# Patient Record
Sex: Female | Born: 1955
Health system: Southern US, Community
[De-identification: ages and names within clinical notes are randomized; demographics above are authoritative.]

## PROBLEM LIST (undated history)

## (undated) DIAGNOSIS — I10 Essential (primary) hypertension: Secondary | ICD-10-CM

## (undated) DIAGNOSIS — R112 Nausea with vomiting, unspecified: Secondary | ICD-10-CM

## (undated) DIAGNOSIS — F319 Bipolar disorder, unspecified: Secondary | ICD-10-CM

## (undated) DIAGNOSIS — F329 Major depressive disorder, single episode, unspecified: Secondary | ICD-10-CM

## (undated) DIAGNOSIS — Q782 Osteopetrosis: Secondary | ICD-10-CM

## (undated) DIAGNOSIS — H9190 Unspecified hearing loss, unspecified ear: Secondary | ICD-10-CM

## (undated) DIAGNOSIS — Z9889 Other specified postprocedural states: Secondary | ICD-10-CM

## (undated) DIAGNOSIS — M545 Low back pain, unspecified: Secondary | ICD-10-CM

## (undated) DIAGNOSIS — E119 Type 2 diabetes mellitus without complications: Secondary | ICD-10-CM

## (undated) DIAGNOSIS — E1069 Type 1 diabetes mellitus with other specified complication: Secondary | ICD-10-CM

## (undated) DIAGNOSIS — R011 Cardiac murmur, unspecified: Secondary | ICD-10-CM

## (undated) DIAGNOSIS — Z9641 Presence of insulin pump (external) (internal): Secondary | ICD-10-CM

## (undated) DIAGNOSIS — M4802 Spinal stenosis, cervical region: Secondary | ICD-10-CM

## (undated) DIAGNOSIS — E785 Hyperlipidemia, unspecified: Secondary | ICD-10-CM

## (undated) DIAGNOSIS — M47812 Spondylosis without myelopathy or radiculopathy, cervical region: Secondary | ICD-10-CM

## (undated) DIAGNOSIS — M6780 Other specified disorders of synovium and tendon, unspecified site: Secondary | ICD-10-CM

## (undated) DIAGNOSIS — E079 Disorder of thyroid, unspecified: Secondary | ICD-10-CM

## (undated) DIAGNOSIS — M720 Palmar fascial fibromatosis [Dupuytren]: Secondary | ICD-10-CM

## (undated) DIAGNOSIS — K219 Gastro-esophageal reflux disease without esophagitis: Secondary | ICD-10-CM

## (undated) DIAGNOSIS — F419 Anxiety disorder, unspecified: Secondary | ICD-10-CM

## (undated) DIAGNOSIS — E1065 Type 1 diabetes mellitus with hyperglycemia: Secondary | ICD-10-CM

## (undated) DIAGNOSIS — J189 Pneumonia, unspecified organism: Secondary | ICD-10-CM

## (undated) DIAGNOSIS — G5600 Carpal tunnel syndrome, unspecified upper limb: Secondary | ICD-10-CM

## (undated) DIAGNOSIS — C801 Malignant (primary) neoplasm, unspecified: Secondary | ICD-10-CM

## (undated) DIAGNOSIS — E1140 Type 2 diabetes mellitus with diabetic neuropathy, unspecified: Secondary | ICD-10-CM

## (undated) DIAGNOSIS — G8929 Other chronic pain: Secondary | ICD-10-CM

## (undated) DIAGNOSIS — G4730 Sleep apnea, unspecified: Secondary | ICD-10-CM

## (undated) DIAGNOSIS — M797 Fibromyalgia: Secondary | ICD-10-CM

## (undated) DIAGNOSIS — Z8709 Personal history of other diseases of the respiratory system: Secondary | ICD-10-CM

## (undated) DIAGNOSIS — R51 Headache: Secondary | ICD-10-CM

## (undated) HISTORY — PX: DUPUYTREN CONTRACTURE RELEASE: SHX1478

## (undated) HISTORY — DX: Type 2 diabetes mellitus without complications: E11.9

## (undated) HISTORY — PX: ELBOW SURGERY: SHX618

## (undated) HISTORY — PX: REFRACTIVE SURGERY: SHX103

## (undated) HISTORY — DX: Disorder of thyroid, unspecified: E07.9

## (undated) HISTORY — PX: INCISION AND DRAINAGE OF WOUND: SHX1803

## (undated) HISTORY — PX: HAND SURGERY: SHX662

## (undated) HISTORY — PX: EYE SURGERY: SHX253

## (undated) HISTORY — PX: CERVICAL LAMINECTOMY: SHX94

---
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
## 1998-04-10 ENCOUNTER — Encounter: Admission: RE | Admit: 1998-04-10 | Discharge: 1998-07-09 | Payer: Self-pay | Admitting: Endocrinology

## 1998-10-16 ENCOUNTER — Emergency Department (HOSPITAL_COMMUNITY): Admission: EM | Admit: 1998-10-16 | Discharge: 1998-10-16 | Payer: Self-pay | Admitting: Emergency Medicine

## 1998-10-17 ENCOUNTER — Encounter: Payer: Self-pay | Admitting: Emergency Medicine

## 1999-05-11 ENCOUNTER — Encounter: Admission: RE | Admit: 1999-05-11 | Discharge: 1999-08-09 | Payer: Self-pay | Admitting: Endocrinology

## 2001-03-08 ENCOUNTER — Encounter (INDEPENDENT_AMBULATORY_CARE_PROVIDER_SITE_OTHER): Payer: Self-pay | Admitting: Specialist

## 2001-03-08 ENCOUNTER — Ambulatory Visit (HOSPITAL_COMMUNITY): Admission: RE | Admit: 2001-03-08 | Discharge: 2001-03-08 | Payer: Self-pay | Admitting: *Deleted

## 2001-08-25 ENCOUNTER — Other Ambulatory Visit: Admission: RE | Admit: 2001-08-25 | Discharge: 2001-08-25 | Payer: Self-pay | Admitting: Endocrinology

## 2001-08-25 ENCOUNTER — Encounter: Payer: Self-pay | Admitting: Cardiovascular Disease

## 2002-10-12 ENCOUNTER — Ambulatory Visit (HOSPITAL_COMMUNITY): Admission: RE | Admit: 2002-10-12 | Discharge: 2002-10-12 | Payer: Self-pay | Admitting: Endocrinology

## 2002-10-12 ENCOUNTER — Encounter: Payer: Self-pay | Admitting: Endocrinology

## 2002-10-15 ENCOUNTER — Inpatient Hospital Stay (HOSPITAL_COMMUNITY): Admission: EM | Admit: 2002-10-15 | Discharge: 2002-10-18 | Payer: Self-pay | Admitting: Endocrinology

## 2002-10-15 ENCOUNTER — Emergency Department (HOSPITAL_COMMUNITY): Admission: EM | Admit: 2002-10-15 | Discharge: 2002-10-15 | Payer: Self-pay | Admitting: Emergency Medicine

## 2002-10-15 ENCOUNTER — Encounter: Payer: Self-pay | Admitting: Endocrinology

## 2003-04-26 ENCOUNTER — Other Ambulatory Visit (HOSPITAL_COMMUNITY): Admission: RE | Admit: 2003-04-26 | Discharge: 2003-07-25 | Payer: Self-pay | Admitting: Psychiatry

## 2003-05-20 ENCOUNTER — Encounter: Admission: RE | Admit: 2003-05-20 | Discharge: 2003-08-18 | Payer: Self-pay | Admitting: Endocrinology

## 2003-09-12 ENCOUNTER — Encounter: Admission: RE | Admit: 2003-09-12 | Discharge: 2003-12-11 | Payer: Self-pay | Admitting: Endocrinology

## 2005-02-01 ENCOUNTER — Ambulatory Visit (HOSPITAL_COMMUNITY): Admission: RE | Admit: 2005-02-01 | Discharge: 2005-02-01 | Payer: Self-pay

## 2005-02-09 ENCOUNTER — Encounter: Admission: RE | Admit: 2005-02-09 | Discharge: 2005-02-09 | Payer: Self-pay

## 2005-02-24 ENCOUNTER — Encounter: Admission: RE | Admit: 2005-02-24 | Discharge: 2005-02-24 | Payer: Self-pay | Admitting: Neurosurgery

## 2005-03-10 ENCOUNTER — Encounter: Admission: RE | Admit: 2005-03-10 | Discharge: 2005-03-10 | Payer: Self-pay

## 2006-02-04 ENCOUNTER — Encounter: Admission: RE | Admit: 2006-02-04 | Discharge: 2006-02-04 | Payer: Self-pay

## 2006-02-24 ENCOUNTER — Encounter: Admission: RE | Admit: 2006-02-24 | Discharge: 2006-02-24 | Payer: Self-pay | Admitting: Endocrinology

## 2006-03-17 ENCOUNTER — Encounter: Admission: RE | Admit: 2006-03-17 | Discharge: 2006-03-17 | Payer: Self-pay | Admitting: Endocrinology

## 2006-04-07 ENCOUNTER — Encounter: Admission: RE | Admit: 2006-04-07 | Discharge: 2006-04-07 | Payer: Self-pay | Admitting: Endocrinology

## 2009-02-06 ENCOUNTER — Encounter: Admission: RE | Admit: 2009-02-06 | Discharge: 2009-04-22 | Payer: Self-pay | Admitting: Endocrinology

## 2009-06-21 ENCOUNTER — Encounter: Admission: RE | Admit: 2009-06-21 | Discharge: 2009-06-21 | Payer: Self-pay | Admitting: Orthopedic Surgery

## 2009-10-31 ENCOUNTER — Emergency Department (HOSPITAL_BASED_OUTPATIENT_CLINIC_OR_DEPARTMENT_OTHER): Admission: EM | Admit: 2009-10-31 | Discharge: 2009-10-31 | Payer: Self-pay | Admitting: Emergency Medicine

## 2009-10-31 ENCOUNTER — Ambulatory Visit: Payer: Self-pay | Admitting: Diagnostic Radiology

## 2009-11-01 ENCOUNTER — Emergency Department (HOSPITAL_BASED_OUTPATIENT_CLINIC_OR_DEPARTMENT_OTHER): Admission: EM | Admit: 2009-11-01 | Discharge: 2009-11-01 | Payer: Self-pay | Admitting: Emergency Medicine

## 2009-11-02 ENCOUNTER — Emergency Department (HOSPITAL_BASED_OUTPATIENT_CLINIC_OR_DEPARTMENT_OTHER): Admission: EM | Admit: 2009-11-02 | Discharge: 2009-11-02 | Payer: Self-pay | Admitting: Emergency Medicine

## 2009-11-03 ENCOUNTER — Inpatient Hospital Stay (HOSPITAL_COMMUNITY): Admission: EM | Admit: 2009-11-03 | Discharge: 2009-11-07 | Payer: Self-pay | Admitting: Orthopedic Surgery

## 2009-11-22 ENCOUNTER — Ambulatory Visit (HOSPITAL_COMMUNITY)
Admission: RE | Admit: 2009-11-22 | Discharge: 2009-11-22 | Payer: Self-pay | Source: Home / Self Care | Admitting: Orthopedic Surgery

## 2010-01-16 ENCOUNTER — Encounter: Payer: Self-pay | Admitting: Cardiovascular Disease

## 2010-03-30 ENCOUNTER — Encounter: Payer: Self-pay | Admitting: Cardiovascular Disease

## 2010-04-02 ENCOUNTER — Encounter: Payer: Self-pay | Admitting: Cardiovascular Disease

## 2010-04-18 ENCOUNTER — Encounter: Payer: Self-pay | Admitting: Cardiovascular Disease

## 2010-04-27 DIAGNOSIS — M899 Disorder of bone, unspecified: Secondary | ICD-10-CM | POA: Insufficient documentation

## 2010-04-27 DIAGNOSIS — I10 Essential (primary) hypertension: Secondary | ICD-10-CM | POA: Insufficient documentation

## 2010-04-27 DIAGNOSIS — M949 Disorder of cartilage, unspecified: Secondary | ICD-10-CM

## 2010-04-27 DIAGNOSIS — J309 Allergic rhinitis, unspecified: Secondary | ICD-10-CM | POA: Insufficient documentation

## 2010-04-27 DIAGNOSIS — K219 Gastro-esophageal reflux disease without esophagitis: Secondary | ICD-10-CM | POA: Insufficient documentation

## 2010-04-27 DIAGNOSIS — E785 Hyperlipidemia, unspecified: Secondary | ICD-10-CM

## 2010-04-27 DIAGNOSIS — F329 Major depressive disorder, single episode, unspecified: Secondary | ICD-10-CM

## 2010-04-27 DIAGNOSIS — E109 Type 1 diabetes mellitus without complications: Secondary | ICD-10-CM | POA: Insufficient documentation

## 2010-04-27 DIAGNOSIS — E1069 Type 1 diabetes mellitus with other specified complication: Secondary | ICD-10-CM | POA: Insufficient documentation

## 2010-04-27 DIAGNOSIS — K589 Irritable bowel syndrome without diarrhea: Secondary | ICD-10-CM | POA: Insufficient documentation

## 2010-04-27 DIAGNOSIS — K5289 Other specified noninfective gastroenteritis and colitis: Secondary | ICD-10-CM | POA: Insufficient documentation

## 2010-04-27 DIAGNOSIS — F32A Depression, unspecified: Secondary | ICD-10-CM | POA: Insufficient documentation

## 2010-04-27 DIAGNOSIS — E1139 Type 2 diabetes mellitus with other diabetic ophthalmic complication: Secondary | ICD-10-CM | POA: Insufficient documentation

## 2010-05-05 ENCOUNTER — Ambulatory Visit: Payer: Self-pay | Admitting: Cardiovascular Disease

## 2010-05-05 DIAGNOSIS — R0602 Shortness of breath: Secondary | ICD-10-CM | POA: Insufficient documentation

## 2010-05-05 DIAGNOSIS — R079 Chest pain, unspecified: Secondary | ICD-10-CM | POA: Insufficient documentation

## 2010-05-18 ENCOUNTER — Telehealth (INDEPENDENT_AMBULATORY_CARE_PROVIDER_SITE_OTHER): Payer: Self-pay | Admitting: Radiology

## 2010-05-19 ENCOUNTER — Encounter (HOSPITAL_COMMUNITY)
Admission: RE | Admit: 2010-05-19 | Discharge: 2010-07-18 | Payer: Self-pay | Source: Home / Self Care | Attending: Cardiovascular Disease | Admitting: Cardiovascular Disease

## 2010-05-19 ENCOUNTER — Encounter: Payer: Self-pay | Admitting: Cardiovascular Disease

## 2010-05-19 ENCOUNTER — Ambulatory Visit: Payer: Self-pay

## 2010-05-19 ENCOUNTER — Ambulatory Visit (HOSPITAL_COMMUNITY): Admission: RE | Admit: 2010-05-19 | Discharge: 2010-05-19 | Payer: Self-pay | Admitting: Cardiovascular Disease

## 2010-05-19 ENCOUNTER — Ambulatory Visit: Payer: Self-pay | Admitting: Cardiovascular Disease

## 2010-07-19 HISTORY — PX: CARPAL TUNNEL RELEASE: SHX101

## 2010-08-12 ENCOUNTER — Ambulatory Visit (HOSPITAL_COMMUNITY)
Admission: RE | Admit: 2010-08-12 | Discharge: 2010-08-12 | Payer: Self-pay | Source: Home / Self Care | Attending: Orthopedic Surgery | Admitting: Orthopedic Surgery

## 2010-08-12 LAB — CBC
HCT: 41.6 % (ref 36.0–46.0)
Hemoglobin: 13.7 g/dL (ref 12.0–15.0)
MCH: 28.7 pg (ref 26.0–34.0)
MCHC: 32.9 g/dL (ref 30.0–36.0)
MCV: 87 fL (ref 78.0–100.0)
Platelets: 304 10*3/uL (ref 150–400)
RBC: 4.78 MIL/uL (ref 3.87–5.11)
RDW: 12.3 % (ref 11.5–15.5)
WBC: 10.6 10*3/uL — ABNORMAL HIGH (ref 4.0–10.5)

## 2010-08-12 LAB — SURGICAL PCR SCREEN
MRSA, PCR: NEGATIVE
Staphylococcus aureus: NEGATIVE

## 2010-08-12 LAB — BASIC METABOLIC PANEL
BUN: 12 mg/dL (ref 6–23)
CO2: 25 mEq/L (ref 19–32)
Calcium: 9.4 mg/dL (ref 8.4–10.5)
Chloride: 98 mEq/L (ref 96–112)
Creatinine, Ser: 0.78 mg/dL (ref 0.4–1.2)
GFR calc Af Amer: >60 mL/min (ref >60)
GFR calc non Af Amer: >60 mL/min (ref >60)
Glucose, Bld: 259 mg/dL — ABNORMAL HIGH (ref 70–99)
Potassium: 4.4 mEq/L (ref 3.5–5.1)
Sodium: 135 mEq/L (ref 135–145)

## 2010-08-12 LAB — GLUCOSE, CAPILLARY
Glucose-Capillary: 255 mg/dL — ABNORMAL HIGH (ref 70–99)
Glucose-Capillary: 245 mg/dL — ABNORMAL HIGH (ref 70–99)

## 2010-08-18 NOTE — Letter (Signed)
Summary: [facility]  [facility]   Imported By: Marylou Mccoy 04/28/2010 08:00:33  _____________________________________________________________________  External Attachment:    Type:   Image     Comment:   External Document

## 2010-08-18 NOTE — Assessment & Plan Note (Signed)
Summary: Cardiology Nuclear Testing  Nuclear Med Background Indications for Stress Test: Evaluation for Ischemia     Symptoms: Chest Pain, DOE, SOB    Nuclear Pre-Procedure Cardiac Risk Factors: Hypertension, IDDM Type 1, Lipids, Smoker Caffeine/Decaff Intake: none NPO After: 10:00 PM Lungs: clear IV 0.9% NS with Angio Cath: 22g     IV Site: R Hand IV Started by: Doyne Keel, CNMT Chest Size (in) 36     Cup Size B     Height (in): 68 Weight (lb): 164 BMI: 25.03 Tech Comments: no meds this morning  Nuclear Med Study 1 or 2 day study:  1 day     Stress Test Type:  Treadmill/Lexiscan Reading MD:  Kristeen Miss, MD     Referring MD:  P.Nishan Resting Radionuclide:  Technetium 29m Tetrofosmin     Resting Radionuclide Dose:  11 mCi  Stress Radionuclide:  Technetium 74m Tetrofosmin     Stress Radionuclide Dose:  33 mCi   Stress Protocol  Max Systolic BP: 130 mm Hg Lexiscan: 0.4 mg   Stress Test Technologist:  Milana Na, EMT-P     Nuclear Technologist:  Doyne Keel, CNMT  Rest Procedure  Myocardial perfusion imaging was performed at rest 45 minutes following the intravenous administration of Technetium 16m Tetrofosmin.  Stress Procedure  The patient received IV Lexiscan 0.4 mg over 15-seconds with concurrent low level exercise and then Technetium 47m Tetrofosmin was injected at 30-seconds while the patient continued walking one more minute.  There was SOB but no significant changes with Lexiscan.  Quantitative spect images were obtained after a 45 minute delay.  QPS Raw Data Images:  Normal; no motion artifact; normal heart/lung ratio. Stress Images:  There is a very small area of attenuation of the apex.  The uptake is otherwise normal throughout the LV myocardium Rest Images:  Normal homogeneous uptake in all areas of the myocardium. Subtraction (SDS):  There is a very small area of redistribution of the apex. Transient Ischemic Dilatation:  1.12  (Normal <1.22)  Lung/Heart Ratio:  0.25  (Normal <0.45)  Quantitative Gated Spect Images QGS EDV:  60 ml QGS ESV:  17 ml QGS EF:  72 % QGS cine images:  Normal LV function.  Findings Low risk nuclear study      Overall Impression  Exercise Capacity: Lexiscan with no exercise. BP Response: Normal blood pressure response. Clinical Symptoms: No chest pain ECG Impression: No significant ST segment change suggestive of ischemia. Overall Impression: There is a very small area of redistribution at the apex.   Overall Impression Comments: Low risk stress myoview.  There is a very small area of redistribution at the apex.  The size of the defect is quite small.  The overall LV function is normal.  Appended Document: Cardiology Nuclear Testing low risk study.  F/U with me 6 months  Appended Document: Cardiology Nuclear Testing PT AWARE./CY

## 2010-08-18 NOTE — Progress Notes (Signed)
Summary: nuc pre-procedure  Phone Note Outgoing Call   Call placed by: Domenic Polite, CNMT,  May 18, 2010 2:35 PM Call placed to: Patient Reason for Call: Confirm/change Appt Summary of Call: Left message with information on Myoview Information Sheet (see scanned document for details).  Initial call taken by: Domenic Polite, CNMT,  May 18, 2010 2:35 PM     Nuclear Med Background Indications for Stress Test: Evaluation for Ischemia     Symptoms: Chest Pain, DOE, SOB    Nuclear Pre-Procedure Cardiac Risk Factors: Hypertension, IDDM Type 1, Lipids, Smoker Height (in): 68

## 2010-08-18 NOTE — Letter (Signed)
Summary: Self-Recorded Physician Lists  Self-Recorded Physician Lists   Imported By: Marylou Mccoy 05/19/2010 15:20:18  _____________________________________________________________________  External Attachment:    Type:   Image     Comment:   External Document

## 2010-08-18 NOTE — Assessment & Plan Note (Signed)
Summary: excer dyspenea/ consult/mt   CC:  lightheaded and sob after exertion.  History of Present Illness: 55 yo with bilpolar disease and DM over 40 years.  Increaseing exeetional dyspnea with SSCP.  Occurs with walking and exertion but sometimes at rest.  CRF's incule DM, HTN  and elevated lipids.  Pain for last 2 years but recently concerned due to diabetic retinopathy and concern of spread of vascular disease to her heart.  Denies palpitations, syncope, edema or PND, orhtopnea.  No chronic lung disease, cough or fever.  Has not had recent cardiac w/u  Current Problems (verified): 1)  Dyspnea  (ICD-786.05) 2)  Chest Pain Unspecified  (ICD-786.50) 3)  Hypertension  (ICD-401.9) 4)  Hyperlipidemia  (ICD-272.4) 5)  Depression  (ICD-311) 6)  Gerd  (ICD-530.81) 7)  Allergic Rhinitis  (ICD-477.9) 8)  Osteopenia  (ICD-733.90) 9)  Diabetic Retinopathy  (ICD-250.50) 10)  Ibs  (ICD-564.1) 11)  Diabetes Mellitus, Type I  (ICD-250.01) 12)  Gastroenteritis  (ICD-558.9)  Current Medications (verified): 1)  Cymbalta 60 Mg Cpep (Duloxetine Hcl) .... 2 Tabs By Mouth Once Daily 2)  Klonopin 1 Mg Tabs (Clonazepam) .Marland Kitchen.. 1 Tab By Mouth Three Times A Day 3)  Benicar 20 Mg Tabs (Olmesartan Medoxomil) .... Take One Tablet By Mouth Daily 4)  Pravastatin Sodium 80 Mg Tabs (Pravastatin Sodium) .... Take One Tablet By Mouth Daily At Bedtime 5)  Trazodone Hcl 100 Mg Tabs (Trazodone Hcl) .Marland Kitchen.. 1 Tab By Mouth Once Daily 6)  Lithium Carbonate 300 Mg Caps (Lithium Carbonate) .Marland Kitchen.. 1 Tab By Mouth Once Daily 7)  Tramadol Hcl 50 Mg Tabs (Tramadol Hcl) .... As Needed 8)  Novolog Mix 70/30 70-30 % Susp (Insulin Aspart Prot & Aspart) .... Sliding Scale 9)  Acetaminophen-Codeine #3 300-30 Mg Tabs (Acetaminophen-Codeine) .... As Needed 10)  Ibuprofen 200 Mg Tabs (Ibuprofen) .... As Needed 11)  Vitamin C 1000 Mg Tabs (Ascorbic Acid) .Marland Kitchen.. 1 Tab By Mouth Once Daily 12)  Vitamin B-6 Cr 200 Mg Cr-Tabs (Pyridoxine Hcl) .Marland Kitchen.. 1  Tab By Mouth Once Daily 13)  Align  Caps (Probiotic Product) .Marland Kitchen.. 1 Tab By Mouth Once Daily 14)  Prevacid 24hr 15 Mg Cpdr (Lansoprazole) .Marland Kitchen.. 1 Tab By Mouth Once Daily  Allergies (verified): No Known Drug Allergies  Past History:  Past Medical History: Last updated: 2010/05/15 HYPERTENSION HYPERLIPIDEMIA  DEPRESSION  GERD  ALLERGIC RHINITIS  OSTEOPENIA DIABETIC  RETINOPATHY  IBS  DIABETES MELLITUS, TYPE I GASTROENTERITIS  Bilateral hearing loss.  Past Surgical History: Last updated: May 15, 2010 Tonsillectomy  Also note that she underwent normal colonoscopy in 1998 and 2002,also underwent endoscopy with [name], [name] in August 2002, that did reveal gastritis.  Family History: Last updated: May 15, 2010  Mother died at 20 of kidney failure and also had Crohn's  disease.  Her father died at 68.  He was an alcoholic and had heart disease.  She has two brothers, both of whom are alive and well.  Social History: Last updated: May 15, 2010  She is divorced and has two adopted children.  They are  not officially adopted but very close to her.  Very rare use of alcohol.  Has smoked 1/2-1 pack-per-day of cigarettes for many years.  Works at [employer] and does live alone.  Review of Systems       Denies fever, malais, weight loss, blurry vision, decreased visual acuity, cough, sputum, hemoptysis, pleuritic pain, palpitaitons, heartburn, abdominal pain, melena, lower extremity edema, claudication, or rash.   Vital Signs:  Patient profile:   55 year old female Height:      68 inches Weight:      169 pounds BMI:     25.79 Pulse rate:   85 / minute Resp:     14 per minute BP sitting:   110 / 60  (left arm)  Vitals Entered By: Kem Parkinson (May 05, 2010 11:45 AM)  Physical Exam  General:  Affect appropriate Healthy:  appears stated age HEENT: normal Neck supple with no adenopathy JVP normal no bruits no thyromegaly Lungs clear with no wheezing and good  diaphragmatic motion Heart:  S1/S2 no murmur,rub, gallop or click PMI normal Abdomen: benighn, BS positve, no tenderness, no AAA no bruit.  No HSM or HJR Distal pulses intact with no bruits No edema Neuro non-focal Skin warm and dry    Impression & Recommendations:  Problem # 1:  DYSPNEA (ICD-786.05) Check echo no obvious cardiopulmonary abnormality Her updated medication list for this problem includes:    Benicar 20 Mg Tabs (Olmesartan medoxomil) .Marland Kitchen... Take one tablet by mouth daily  Orders: Echocardiogram (Echo) Nuclear Stress Test (Nuc Stress Test)  Problem # 2:  CHEST PAIN UNSPECIFIED (ICD-786.50) Long standing DM  Needs myovue Orders: Nuclear Stress Test (Nuc Stress Test)  Problem # 3:  HYPERTENSION (ICD-401.9) Well controlled Continue ACE given DM Her updated medication list for this problem includes:    Benicar 20 Mg Tabs (Olmesartan medoxomil) .Marland Kitchen... Take one tablet by mouth daily  Problem # 4:  HYPERLIPIDEMIA (ICD-272.4) Continue statin labs per primary Her updated medication list for this problem includes:    Pravastatin Sodium 80 Mg Tabs (Pravastatin sodium) .Marland Kitchen... Take one tablet by mouth daily at bedtime  Patient Instructions: 1)  Your physician recommends that you schedule a follow-up appointment in: YEAR WITH DR Eden Emms 2)  Your physician has recommended you make the following change in your medication:  3)  Your physician has requested that you have an echocardiogram.  Echocardiography is a painless test that uses sound waves to create images of your heart. It provides your doctor with information about the size and shape of your heart and how well your heart's chambers and valves are working.  This procedure takes approximately one hour. There are no restrictions for this procedure. 4)  Your physician has requested that you have an exercise stress myoview.  For further information please visit [email or web address]  Please follow instruction sheet, as given.

## 2010-08-20 NOTE — Letter (Signed)
Summary: Self-Recorded Meds  Self-Recorded Meds   Imported By: Marylou Mccoy 07/09/2010 15:56:46  _____________________________________________________________________  External Attachment:    Type:   Image     Comment:   External Document

## 2010-09-02 NOTE — Op Note (Signed)
Brooke Avery, Brooke Avery                 ACCOUNT NO.:  [id number]  MEDICAL RECORD NO.:  [id number]          PATIENT TYPE:  AMB  LOCATION:  SDS                          FACILITY:  [facility]  PHYSICIAN:  Madelynn Done, MD  DATE OF BIRTH:  06-Oct-1955  DATE OF PROCEDURE:  08/12/2010 DATE OF DISCHARGE:  08/12/2010                              OPERATIVE REPORT   PREOPERATIVE DIAGNOSIS:  Left hand carpal tunnel syndrome  POSTOPERATIVE DIAGNOSIS:  Left hand carpal tunnel syndrome  ATTENDING PHYSICIAN:  Sharma Covert IV, MD, was scrubbed and present for the entire procedure.  ASSISTANT SURGEON:  None.  SURGICAL PROCEDURE:  Left hand carpal tunnel release.  ANESTHESIA:  Lidocaine 1% and 0.25% Marcaine local block with IV sedation.  SURGICAL INDICATIONS:  Brooke Avery is a 55 year old female with numbness in her left hand.  The patient had positive electrical diagnostic test. The patient elected to undergo the above procedure.  We talked about the degree and nature of her symptoms.  She did have a component of neuropathy as well as compression at the level of the elbow.  After talking with her, we elected to proceed with a carpal tunnel only.  We talked about the risks of surgery to include but not limited to bleeding, infection, damage to nearby nerves, arteries, or tendons, loss of motion of the elbow, wrists, and digits, and need for further surgical intervention.  DESCRIPTION OF PROCEDURE:  The patient was properly identified in the preoperative holding area and mark with permanent marker made on the left hand to indicate correct operative site.  The patient was then brought back to the operating room, placed supine on the anesthesia room table where the IV sedation was administered.  The patient tolerated this well.  A well-padded tourniquet was then placed on the left forearm and sealed with 1000 drape.  Local anesthetic was administered.  The left upper extremity was then prepped  and draped in normal sterile fashion.  Time-out was called, the correct side was identified, the procedure was then begun.  Several centimeter incision was then directly made in the mid palm.  Dissection was then carried down through the skin and subcutaneous tissues.  The patient had received preoperative antibiotics.  The tourniquet was insufflated.  Dissection was carried down to the palmar fascia which was identified and incised longitudinally.  Exposure of the transverse carpal ligament was then carried out.  Under direct visualization, the transverse carpal ligament was then released proximally with a 15 blade.  Following release the transverse carpal ligament distally, further exposure was carried out proximally, and under direct visualization the remaining portion of the transverse carpal ligament was released as well as portion of the antebrachial fascia.  The wound was then thoroughly irrigated.  The carpal canal were then inspected.  No other abnormalities were noted. Copious irrigation was then done throughout.  Tourniquet deflated. Hemostasis was obtained.  The wound was then irrigated and closed with a simple 4-0 horizontal mattress Prolene sutures.  Xeroform dressing and sterile compressive bandage was then applied.  The patient was then taken to recovery room in good  condition.  POSTOPERATIVE PLAN:  The patient was discharged home and see back in the office in approximately 12 days for wound check, suture removal, and then begin a postoperative carpal tunnel eval.     Madelynn Done, MD     FWO/MEDQ  D:  08/12/2010  T:  08/12/2010  Job:  [id number]  Electronically Signed by Bradly Bienenstock IV MD on 09/02/2010 02:19:29 PM

## 2010-10-06 LAB — GLUCOSE, CAPILLARY
Glucose-Capillary: 240 mg/dL — ABNORMAL HIGH (ref 70–99)
Glucose-Capillary: 271 mg/dL — ABNORMAL HIGH (ref 70–99)
Glucose-Capillary: 284 mg/dL — ABNORMAL HIGH (ref 70–99)
Glucose-Capillary: 103 mg/dL — ABNORMAL HIGH (ref 70–99)
Glucose-Capillary: 122 mg/dL — ABNORMAL HIGH (ref 70–99)
Glucose-Capillary: 156 mg/dL — ABNORMAL HIGH (ref 70–99)
Glucose-Capillary: 170 mg/dL — ABNORMAL HIGH (ref 70–99)
Glucose-Capillary: 177 mg/dL — ABNORMAL HIGH (ref 70–99)
Glucose-Capillary: 186 mg/dL — ABNORMAL HIGH (ref 70–99)
Glucose-Capillary: 189 mg/dL — ABNORMAL HIGH (ref 70–99)
Glucose-Capillary: 194 mg/dL — ABNORMAL HIGH (ref 70–99)
Glucose-Capillary: 220 mg/dL — ABNORMAL HIGH (ref 70–99)
Glucose-Capillary: 227 mg/dL — ABNORMAL HIGH (ref 70–99)
Glucose-Capillary: 242 mg/dL — ABNORMAL HIGH (ref 70–99)
Glucose-Capillary: 262 mg/dL — ABNORMAL HIGH (ref 70–99)
Glucose-Capillary: 305 mg/dL — ABNORMAL HIGH (ref 70–99)
Glucose-Capillary: 305 mg/dL — ABNORMAL HIGH (ref 70–99)
Glucose-Capillary: 92 mg/dL (ref 70–99)

## 2010-10-06 LAB — BASIC METABOLIC PANEL
BUN: 10 mg/dL (ref 6–23)
CO2: 26 mEq/L (ref 19–32)
Calcium: 9.6 mg/dL (ref 8.4–10.5)
Chloride: 105 mEq/L (ref 96–112)
Creatinine, Ser: 0.73 mg/dL (ref 0.4–1.2)
GFR calc Af Amer: >60 mL/min (ref >60)
GFR calc non Af Amer: >60 mL/min (ref >60)
Glucose, Bld: 251 mg/dL — ABNORMAL HIGH (ref 70–99)
Potassium: 4.8 mEq/L (ref 3.5–5.1)
Sodium: 139 mEq/L (ref 135–145)

## 2010-10-06 LAB — DIFFERENTIAL
Basophils Absolute: 0.1 10*3/uL (ref 0.0–0.1)
Basophils Absolute: 0 10*3/uL (ref 0.0–0.1)
Basophils Relative: 1 % (ref 0–1)
Basophils Relative: 0 % (ref 0–1)
Eosinophils Absolute: 0.4 10*3/uL (ref 0.0–0.7)
Eosinophils Absolute: 0.2 10*3/uL (ref 0.0–0.7)
Eosinophils Relative: 5 % (ref 0–5)
Eosinophils Relative: 2 % (ref 0–5)
Lymphocytes Relative: 42 % (ref 12–46)
Lymphocytes Relative: 23 % (ref 12–46)
Lymphs Abs: 3.5 10*3/uL (ref 0.7–4.0)
Lymphs Abs: 2.6 10*3/uL (ref 0.7–4.0)
Monocytes Absolute: 0.7 10*3/uL (ref 0.1–1.0)
Monocytes Absolute: 0.8 10*3/uL (ref 0.1–1.0)
Monocytes Relative: 8 % (ref 3–12)
Monocytes Relative: 7 % (ref 3–12)
Neutro Abs: 3.6 10*3/uL (ref 1.7–7.7)
Neutro Abs: 7.9 10*3/uL — ABNORMAL HIGH (ref 1.7–7.7)
Neutrophils Relative %: 43 % (ref 43–77)
Neutrophils Relative %: 68 % (ref 43–77)

## 2010-10-06 LAB — COMPREHENSIVE METABOLIC PANEL
ALT: 11 U/L (ref 0–35)
AST: 19 U/L (ref 0–37)
Albumin: 3.8 g/dL (ref 3.5–5.2)
Alkaline Phosphatase: 79 U/L (ref 39–117)
BUN: 10 mg/dL (ref 6–23)
CO2: 26 mEq/L (ref 19–32)
Calcium: 9.2 mg/dL (ref 8.4–10.5)
Chloride: 102 mEq/L (ref 96–112)
Creatinine, Ser: 0.67 mg/dL (ref 0.4–1.2)
GFR calc Af Amer: >60 mL/min (ref >60)
GFR calc non Af Amer: >60 mL/min (ref >60)
Glucose, Bld: 146 mg/dL — ABNORMAL HIGH (ref 70–99)
Potassium: 3.8 mEq/L (ref 3.5–5.1)
Sodium: 135 mEq/L (ref 135–145)
Total Bilirubin: 0.8 mg/dL (ref 0.3–1.2)
Total Protein: 6.8 g/dL (ref 6.0–8.3)

## 2010-10-06 LAB — CBC
HCT: 39.3 % (ref 36.0–46.0)
HCT: 39.7 % (ref 36.0–46.0)
Hemoglobin: 13.6 g/dL (ref 12.0–15.0)
Hemoglobin: 13.6 g/dL (ref 12.0–15.0)
MCHC: 34.7 g/dL (ref 30.0–36.0)
MCHC: 34.4 g/dL (ref 30.0–36.0)
MCV: 89.1 fL (ref 78.0–100.0)
MCV: 89.7 fL (ref 78.0–100.0)
Platelets: 361 10*3/uL (ref 150–400)
Platelets: 122 10*3/uL — ABNORMAL LOW (ref 150–400)
RBC: 4.42 MIL/uL (ref 3.87–5.11)
RBC: 4.42 MIL/uL (ref 3.87–5.11)
RDW: 12.2 % (ref 11.5–15.5)
RDW: 12.1 % (ref 11.5–15.5)
WBC: 8.3 10*3/uL (ref 4.0–10.5)
WBC: 11.6 10*3/uL — ABNORMAL HIGH (ref 4.0–10.5)

## 2010-10-06 LAB — WOUND CULTURE: Culture: NO GROWTH

## 2010-10-06 LAB — ANAEROBIC CULTURE

## 2010-10-06 LAB — POCT I-STAT GLUCOSE
Glucose, Bld: 115 mg/dL — ABNORMAL HIGH (ref 70–99)
Operator id: 153281

## 2010-10-22 ENCOUNTER — Ambulatory Visit (HOSPITAL_BASED_OUTPATIENT_CLINIC_OR_DEPARTMENT_OTHER)
Admission: RE | Admit: 2010-10-22 | Payer: BC Managed Care – PPO | Source: Ambulatory Visit | Admitting: Orthopedic Surgery

## 2010-10-24 ENCOUNTER — Ambulatory Visit (HOSPITAL_COMMUNITY)
Admission: RE | Admit: 2010-10-24 | Discharge: 2010-10-25 | Disposition: A | Discharge status: Home / Self Care | Payer: BC Managed Care – PPO | Source: Ambulatory Visit | Attending: Orthopedic Surgery | Admitting: Orthopedic Surgery

## 2010-10-24 DIAGNOSIS — G5620 Lesion of ulnar nerve, unspecified upper limb: Secondary | ICD-10-CM | POA: Insufficient documentation

## 2010-10-24 DIAGNOSIS — K589 Irritable bowel syndrome without diarrhea: Secondary | ICD-10-CM | POA: Insufficient documentation

## 2010-10-24 DIAGNOSIS — Z794 Long term (current) use of insulin: Secondary | ICD-10-CM | POA: Insufficient documentation

## 2010-10-24 DIAGNOSIS — F17200 Nicotine dependence, unspecified, uncomplicated: Secondary | ICD-10-CM | POA: Insufficient documentation

## 2010-10-24 DIAGNOSIS — E1139 Type 2 diabetes mellitus with other diabetic ophthalmic complication: Secondary | ICD-10-CM | POA: Insufficient documentation

## 2010-10-24 DIAGNOSIS — K219 Gastro-esophageal reflux disease without esophagitis: Secondary | ICD-10-CM | POA: Insufficient documentation

## 2010-10-24 DIAGNOSIS — E11319 Type 2 diabetes mellitus with unspecified diabetic retinopathy without macular edema: Secondary | ICD-10-CM | POA: Insufficient documentation

## 2010-10-24 DIAGNOSIS — I10 Essential (primary) hypertension: Secondary | ICD-10-CM | POA: Insufficient documentation

## 2010-10-24 LAB — GLUCOSE, CAPILLARY
Glucose-Capillary: 177 mg/dL — ABNORMAL HIGH (ref 70–99)
Glucose-Capillary: 202 mg/dL — ABNORMAL HIGH (ref 70–99)
Glucose-Capillary: 188 mg/dL — ABNORMAL HIGH (ref 70–99)
Glucose-Capillary: 171 mg/dL — ABNORMAL HIGH (ref 70–99)
Glucose-Capillary: 155 mg/dL — ABNORMAL HIGH (ref 70–99)

## 2010-10-24 LAB — CBC
HCT: 42.8 % (ref 36.0–46.0)
Hemoglobin: 14.3 g/dL (ref 12.0–15.0)
MCH: 29.4 pg (ref 26.0–34.0)
MCHC: 33.4 g/dL (ref 30.0–36.0)
MCV: 87.9 fL (ref 78.0–100.0)
Platelets: 327 10*3/uL (ref 150–400)
RBC: 4.87 MIL/uL (ref 3.87–5.11)
RDW: 12.6 % (ref 11.5–15.5)
WBC: 7.3 10*3/uL (ref 4.0–10.5)

## 2010-10-24 LAB — BASIC METABOLIC PANEL
BUN: 10 mg/dL (ref 6–23)
CO2: 28 mEq/L (ref 19–32)
Calcium: 9.5 mg/dL (ref 8.4–10.5)
Chloride: 104 mEq/L (ref 96–112)
Creatinine, Ser: 0.67 mg/dL (ref 0.4–1.2)
GFR calc Af Amer: >60 mL/min (ref >60)
GFR calc non Af Amer: >60 mL/min (ref >60)
Glucose, Bld: 173 mg/dL — ABNORMAL HIGH (ref 70–99)
Potassium: 4.4 mEq/L (ref 3.5–5.1)
Sodium: 139 mEq/L (ref 135–145)

## 2010-10-24 LAB — SURGICAL PCR SCREEN
MRSA, PCR: NEGATIVE
Staphylococcus aureus: NEGATIVE

## 2010-10-25 LAB — GLUCOSE, CAPILLARY
Glucose-Capillary: 107 mg/dL — ABNORMAL HIGH (ref 70–99)
Glucose-Capillary: 111 mg/dL — ABNORMAL HIGH (ref 70–99)
Glucose-Capillary: 235 mg/dL — ABNORMAL HIGH (ref 70–99)

## 2010-11-10 NOTE — Op Note (Signed)
NAMEMASEY, SCHEIBER                 ACCOUNT NO.:  [id number]  MEDICAL RECORD NO.:  [id number]           PATIENT TYPE:  I  LOCATION:  5005                         FACILITY:  [facility]  PHYSICIAN:  Madelynn Done, MD  DATE OF BIRTH:  [birth year]  DATE OF PROCEDURE:  10/24/2010 DATE OF DISCHARGE:                              OPERATIVE REPORT   PREOPERATIVE DIAGNOSIS:  Left elbow cubital tunnel.  POSTOPERATIVE DIAGNOSIS:  Left elbow cubital tunnel.  ATTENDING SURGEON:  Madelynn Done, MD who scrubbed and present for the entire procedure.  ASSISTANT SURGEON:  None.  SURGICAL PROCEDURE:  Left elbow ulnar nerve in situ and nerve decompression.  ANESTHESIA:  General via LMA.  SURGICAL INDICATIONS:  [name] is a 55 year old female who had signs and symptoms consistent with left elbow ulnar nerve neuropathy.  The patient had positive nerve conduction studies.  The patient elected to undergo the above procedure.  Risks, benefits, and alternatives were discussed in detail with the patient and a signed informed consent was obtained.  Risks include, but not limited to bleeding, infection, damage to nearby nerves, arteries, or tendons, loss of motion of the elbow, wrist, and digits, and need for further surgical intervention as well as persistent symptoms.  DESCRIPTION OF PROCEDURE:  The patient was appropriately identified in the preop holding area and a mark with permanent marker made on the left elbow to indicate correct operative site.  The patient then brought back to the operating room, placed supine on the anesthesia room table where general anesthesia was administered.  The patient tolerated this well. Well-padded tourniquet was then placed on left brachium, sealed with 1000 drape.  Left upper extremity was then prepped and draped in normal sterile fashion.  Time-out was called, correct side was identified, and the procedure then begun.  Attention then turned to the left  elbow where the limb was then elevated using Esmarch exsanguination and tourniquet insufflated.  A curvilinear incision made directly between the medial epicondyle and olecranon, approximately 6 to 8 cm incision was made. Tourniquet insufflated.  Correct side was identified and the procedure then begun.  Dissection was then carried down through the skin and subcutaneous tissues.  Distal branch of the medial antebrachial cutaneous nerve were then identified and carefully protected.  Further dissection was carried out of the fascial layer proximally.  The ulnar nerve was then identified and then released proximally.  Decompression was then carried out proximally greater than 15 cm proximal to the medial epicondyle.  After release of portion of the medial intermuscular septum was then released.  Further dissection was then carried down through the cubital tunnel, the ulnar nerve was released through the cubital tunnel, Osborne ligament and through the FCU fascia.  Complete release was then done distally.  After in situ decompression, the elbow was then placed through a full range of motion where there was not any tendons, nerve subluxation.  Careful attention was made to protect the neurovascular structures around the nerve given her history of diabetes. After release of the nerve both proximally and distally, the wound was then thoroughly irrigated.  The subcutaneous tissues were then closed with 4-0 Vicryl.  After the tourniquet had been deflated and hemostasis had been obtained, 10 mL of 0.25% Marcaine was infiltrated locally.  The skin was then closed with a running 4-0 Prolene subcuticular suture. Benzoin and Steri-Strips were applied.  The patient was then placed in a sterile compressive bandage and long-arm splint, extubated, and taken to the recovery room in good condition.  POSTOPERATIVE PLAN:  The patient discharged home, seen back in the office in approximately 10 days for wound  check, suture removal, and begin a postoperative in situ nerve decompression protocol.     Madelynn Done, MD     FWO/MEDQ  D:  10/24/2010  T:  10/24/2010  Job:  [id number]  Electronically Signed by Bradly Bienenstock IV MD on 11/10/2010 05:48:46 PM

## 2010-12-04 NOTE — H&P (Signed)
NAME:  Brooke Avery, BRICKNER                           ACCOUNT NO.:  [id number]   MEDICAL RECORD NO.:  [id number]                   PATIENT TYPE:  INP   LOCATION:  0346                                 FACILITY:  [facility]   PHYSICIAN:  Claudie Fisherman, [name]               DATE OF BIRTH:  June 14, 1956   DATE OF ADMISSION:  10/15/2002  DATE OF DISCHARGE:                                HISTORY & PHYSICAL   CHIEF COMPLAINT:  Abdominal pain, nausea and vomiting.   HISTORY OF PRESENT ILLNESS:  A 55 year old, white female patient of W. D.  Kohut, [name]'s, with history of poor diabetic and medical compliance, insulin-  dependent diabetes mellitus, esophageal reflux disease with history of  gastritis, depression, bilateral sensory neural hearing loss, hypertension,  hyperlipidemia, and irritable bowel syndrome, presented to office Friday,  10/12/02, with complaints of epigastric pain with nausea but no vomiting.  At that time, stat labs were completed, and she had normal lipase, amylase,  CBC, comprehensive panel, and urinalysis except that blood sugar was 248,  and she had over 1000 mg of glucose in her urine.  She is on sliding-scale  insulin and had missed her lunchtime dosage of insulin.  Also completed an  abdominal ultrasound that day stat that was normal except slight prominence  of the common bile duct, measuring approximately 8.7 mm, tapering to 4 mm  distally.  She did well Friday night and Saturday and was quite busy without  any complaints at that time.  However, Sunday night, she had a recurrence of  severe epigastric pain with nausea and vomiting.  Nausea symptoms were so  severe that she did go to the emergency room.  There she states blood work  was completed; no x-rays were completed, and she was diagnosed with  gastroenteritis.  She also states that upon discharge, she was vomiting.  Secondary to persistent pain and intractable vomiting, she was back in our  today, and she is being  admitted for further evaluation and treatment.  Also, we are concerned about dehydration and her insulin-dependent diabetes.   Also, during her visit on Friday, she did note falling approximately two  weeks ago with pain on the right side.  Her x-ray that day in the office did  reveal a nondisplaced ninth anterior rib fracture.  Otherwise, at that time,  her PA and lateral chest x-ray were normal. Her abdominal x-ray was normal.   CURRENT MEDICATIONS:  1. Effexor XR 150, 1 daily.  2. Alprazolam 0.5 mg 1 q.12h. p.r.n.  3. Lantus 34 units h.s.  4. Viactiv 3 tabs daily.  5. Cozaar 50 mg 1 daily.  6. Nexium 40 mg 1 daily.  7. Advair 50/100, 1 puff b.i.d.  8. Flonase nasal spray 2 puffs each nostril daily.  9. Aspirin 81 mg daily.  10.      Lipitor 40 mg daily.  11.  Humulog sliding scale t.i.d. with meals.   ALLERGIES AND INTOLERANCES:  ALTACE causes cough.   SOCIAL HISTORY:  She is divorced and has two adopted children.  They are  not officially adopted but very close to her.  Very rare use of alcohol.  Has smoked 1/2-1 pack-per-day of cigarettes for many years.  Works at [employer] and does live alone.   FAMILY HISTORY:  Mother died at 76 of kidney failure and also had Crohn's  disease.  Her father died at 69.  He was an alcoholic and had heart disease.  She has two brothers, both of whom are alive and well.   PAST MEDICAL HISTORY:  1. Insulin-dependent diabetes mellitus with very poor compliance.  2. Allergic rhinitis.  3. Gastroesophageal reflux.  4. Depression.  5. Internal hemorrhoids.  6. Chronic smoker.  7. Bilateral sensory neural hearing loss.  8. Hyperlipidemia.  9. Hypertension.  10.      Irritable bowel syndrome.  11.      Diabetic retinopathy.  12.      Osteopenia.  13.      Tonsillectomy.  14.      Also note that she underwent normal colonoscopy in 1998 and 2002,     also underwent endoscopy with [name], [name] in August 2002, that     did reveal  gastritis.   REVIEW OF SYSTEMS:  Complete review of systems is completed and negative  except as described above.   OBJECTIVE:  VITAL SIGNS:  Age 41, weight 159, temperature 98.9, pulse 88 and  regular.  GENERAL:  Currently a tearful white female, who is lying supine in the fetal  position, clutching an emesis at her chest.  She is breathing easily and  otherwise appears in no acute distress.  SKIN:  Warm and dry.  No evidence of rash.  HEENT:  She wears bilateral hearing aids.  Normocephalic, atraumatic.  Oropharynx is clear.  Mucosa does appear somewhat dry.  NECK:  Supple without lymphadenopathy or bruit.  LUNGS:  Clear to auscultation bilaterally.  CARDIOVASCULAR:  Normal sinus rhythm.  No murmur, rub, or gallop.  ABDOMEN:  Soft.  Bowel sounds are present throughout.  She is nondistended.  She is tender in the epigastrium.  There is no evidence of rebound pain,  mass, organomegaly, and McMurray sign is negative.  RECTAL:  Heme-negative, dark brown stool.  EXTREMITIES:  Without cyanosis, clubbing, or edema.  NEUROLOGIC:  Cranial nerves 2-12 are grossly intact.   ASSESSMENT:  1. Epigastric pain with intractable nausea and vomiting.  2. Insulin-dependent diabetes mellitus, poorly controlled.  3. Allergic rhinitis.  4. Gastroesophageal reflux disease, on daily Nexium.  5. Depression, treated with Effexor XR.  6. Internal hemorrhoids.  7. Chronic smoker.  8. Bilateral sensory neural hearing loss.  9. Hyperlipidemia, treated with Lipitor.  10.      Hypertension.  11.      Irritable bowel syndrome.  12.      Diabetic retinopathy.  13.      Osteopenia, with rib fracture x 2 weeks.  Note that she has been     noncompliant with Fosamax and has not taken samples that were given her     to date.  14.      Tonsillectomy.  15.      Poor compliance.  16.      Status post:     A. Colonoscopy in 1998 and 2002, normal.    B. Status post endoscopy, August  2002, gastritis, [name],  [name]   PLAN:  1. Admit patient to Brooke Bonito, [name] for further evaluation and treatment.  2. Will place patient NPO and obtain abdominal CAT scan.  3. Will also treat nausea with IV Phenergan.  4. Repeat lab work upon arrival.  5. After CT, she may in fact need gastroenterology evaluation to assess for     possible retained stone.  6. Also, secondary to rib fracture and noted osteopenia, will need to begin     her on either Fosamax or Actonel.  We have attempted to start this     previously, but she has been noncompliant with even trying that     medication.     [employer], [place].A.                  Claudie Fisherman, [name]    CB/MEDQ  D:  10/15/2002  T:  10/15/2002  Job:  [id number]

## 2010-12-04 NOTE — Discharge Summary (Signed)
NAME:  Brooke Avery, Brooke Avery                           ACCOUNT NO.:  [id number]   MEDICAL RECORD NO.:  [id number]                   PATIENT TYPE:  INP   LOCATION:  0346                                 FACILITY:  [facility]   PHYSICIAN:  Brooke Bonito, [name]                   DATE OF BIRTH:  [birth year]   DATE OF ADMISSION:  10/15/2002  DATE OF DISCHARGE:  10/18/2002                                 DISCHARGE SUMMARY   DISCHARGE DIAGNOSES:  1. Gastroenteritis.  2. Type 1 diabetes mellitus.  3. Depression.  4. Smoking abuse.  5. Bilateral hearing loss.  6. Hyperlipidemia.  7. Hypertension.  8. Irritable bowel syndrome.  9. Diabetic retinopathy.  10.      Osteopenia.  11.      Poor compliance.   HOSPITAL COURSE:  The patient is a 55 year old female patient admitted from  the office after she presented with epigastric pain, nausea, and vomiting.  She was seen initially in the emergency room the night before, and sent to  the office and was admitted from the office.   The patient was placed n.p.o. and on IV fluids, and her diabetes was  controlled with sliding scale coverage.  Laboratory studies were basically  unremarkable, there were no acute findings suggestive of an acute abdomen.  She gradually improved, and it was felt that she reached maximal  hospitalization on the day of discharge, and she was feeling 90% better, was  ambulatory and eating well.   I had a frank discussion with the patient about her need for better  compliance, and I do think she is going to take this more seriously in the  future.   LABORATORY DATA:  On 10/15/02, white blood cell count 8800, hemoglobin 14.4.  Electrolytes:  Sodium 129, glucose 299, creatinine 0.7.  On 10/18/02, sodium  was 138, potassium 2.8.  Amylase and lipase were normal.  Liver function was  normal.  Urinalysis was unremarkable.  Electrocardiogram noted sinus  tachycardia.  Chest x-ray noted no active lung disease.  CT scan of the  abdomen and  pelvis noted uterine fibroids, splenic infarct, and moderate  periaortic atherosclerosis for age, otherwise negative.   CONDITION ON DISCHARGE:  Much improved.   DISCHARGE MEDICATIONS:  1. Same as on admission.  2. Increasing her Lantus insulin to 26 units daily along with a sliding     scale.  3. She is to stay on Effexor 150 mg daily.  4. Cozaar 50 mg daily.  5. Nexium 40 mg daily.  6. Advair 50/100 one puff b.i.d.  7. Flonase nasal spray.  8. Lipitor 40 mg daily.  9. Aspirin daily.   FOLLOWUP:  Dr. Juleen China in one weeks' time.  Brooke Bonito, [name]    WDK/MEDQ  D:  10/24/2002  T:  10/25/2002  Job:  [id number]

## 2010-12-04 NOTE — Procedures (Signed)
[facility]  Patient:    Brooke Avery, Brooke Avery Visit Number: [id number] MRN: [id number]          Service Type: END Location: ENDO Attending Physician:  Sabino Gasser Proc. Date: 03/08/01 Adm. Date:  03/08/2001                             Procedure Report  PROCEDURE:  Colonoscopy.  INDICATIONS:  Colon cancer screening, rectal bleeding.  ANESTHESIA:  Additional Demerol 20, Versed 1 mg.  DESCRIPTION OF PROCEDURE:  With the patient mildly sedated in the left lateral decubitus position, the Olympus videoscopic colonoscope was inserted in the rectum and passed under direct vision to the cecum, identified by the ileocecal valve and appendiceal orifice, both of which were photographed. From this point, the colonoscope was slowly withdrawn, taking circumferential views of the entire colonic mucosa, stopping only in the rectum which appeared normal on direct and retroflex view.  The endoscope was straightened and withdrawn.  The patients vital signs and pulse oximeter remained stable.  The patient tolerated the procedure well without apparent complications.  FINDINGS:  Essentially negative colonoscopic examination.  PLAN:  See endoscopy note for further details. Attending Physician:  Sabino Gasser DD:  03/08/01 TD:  03/09/01 Job: [id number]

## 2010-12-04 NOTE — Procedures (Signed)
[facility]  Patient:    Brooke Avery, Brooke Avery Visit Number: [id number] MRN: [id number]          Service Type: END Location: ENDO Attending Physician:  Sabino Gasser Proc. Date: 03/08/01 Adm. Date:  03/08/2001                             Procedure Report  PROCEDURE:  Upper endoscopy.  INDICATION FOR PROCEDURE:  Reflux symptomatology.  ANESTHESIA:  Demerol 60, Versed 7.5 mg.  DESCRIPTION OF PROCEDURE:  With the patient mildly sedated in the left lateral decubitus position, the Olympus video endoscope was inserted in the mouth and passed under direct vision through the esophagus which appeared normal until it reached the distal esophagus where there were changes of Barretts esophagus and esophagitis seen, photographed and biopsied. There was a small hiatal hernia evidenced there and this was noted as well. We entered into the stomach. The fundus, body and antrum showed erythema, photographs taken and a biopsy for Helicobacter pylori was obtained.  The endoscope was advanced to the second portion of the duodenum both looked normal. From this point, the endoscope was slowly withdrawn taking circumferential views of the entire duodenal mucosa until the endoscope was then pulled back into the stomach at which point it was placed in retroflexion to view the stomach from below and the hiatal hernia was seen. The endoscope was then straightened and withdrawn taking circumferential views of the remaining gastric and esophageal mucosa. The patients vital signs and pulse oximeter remained stable. The patient tolerated the procedure well without apparent complications.  FINDINGS:  Changes or probable H. pylori gastritis biopsied for CLO study and esophagitis. Rule out Barretts esophagus of the distal esophagus. Await biopsy report. The patient will call me for results and followup with me in as an outpatient. Will proceed to colonoscopy as planned. Attending Physician:   Sabino Gasser DD:  03/08/01 TD:  03/08/01 Job: [id number]

## 2011-07-23 DIAGNOSIS — F3131 Bipolar disorder, current episode depressed, mild: Secondary | ICD-10-CM | POA: Diagnosis not present

## 2011-07-23 DIAGNOSIS — F4310 Post-traumatic stress disorder, unspecified: Secondary | ICD-10-CM | POA: Diagnosis not present

## 2011-07-29 DIAGNOSIS — E789 Disorder of lipoprotein metabolism, unspecified: Secondary | ICD-10-CM | POA: Diagnosis not present

## 2011-08-04 DIAGNOSIS — I10 Essential (primary) hypertension: Secondary | ICD-10-CM | POA: Diagnosis not present

## 2011-08-04 DIAGNOSIS — L570 Actinic keratosis: Secondary | ICD-10-CM | POA: Diagnosis not present

## 2011-08-04 DIAGNOSIS — E789 Disorder of lipoprotein metabolism, unspecified: Secondary | ICD-10-CM | POA: Diagnosis not present

## 2011-08-04 DIAGNOSIS — E119 Type 2 diabetes mellitus without complications: Secondary | ICD-10-CM | POA: Diagnosis not present

## 2011-08-12 DIAGNOSIS — L570 Actinic keratosis: Secondary | ICD-10-CM | POA: Diagnosis not present

## 2011-08-12 DIAGNOSIS — D235 Other benign neoplasm of skin of trunk: Secondary | ICD-10-CM | POA: Diagnosis not present

## 2011-08-12 DIAGNOSIS — D485 Neoplasm of uncertain behavior of skin: Secondary | ICD-10-CM | POA: Diagnosis not present

## 2011-08-12 DIAGNOSIS — L821 Other seborrheic keratosis: Secondary | ICD-10-CM | POA: Diagnosis not present

## 2011-08-17 DIAGNOSIS — M545 Low back pain, unspecified: Secondary | ICD-10-CM | POA: Diagnosis not present

## 2011-08-17 DIAGNOSIS — IMO0002 Reserved for concepts with insufficient information to code with codable children: Secondary | ICD-10-CM | POA: Diagnosis not present

## 2011-08-20 DIAGNOSIS — F4310 Post-traumatic stress disorder, unspecified: Secondary | ICD-10-CM | POA: Diagnosis not present

## 2011-08-23 DIAGNOSIS — F4310 Post-traumatic stress disorder, unspecified: Secondary | ICD-10-CM | POA: Diagnosis not present

## 2011-08-23 DIAGNOSIS — F410 Panic disorder [episodic paroxysmal anxiety] without agoraphobia: Secondary | ICD-10-CM | POA: Diagnosis not present

## 2011-08-23 DIAGNOSIS — F3132 Bipolar disorder, current episode depressed, moderate: Secondary | ICD-10-CM | POA: Diagnosis not present

## 2011-08-26 ENCOUNTER — Other Ambulatory Visit: Payer: Self-pay | Admitting: Neurosurgery

## 2011-08-26 DIAGNOSIS — M542 Cervicalgia: Secondary | ICD-10-CM

## 2011-08-26 DIAGNOSIS — M549 Dorsalgia, unspecified: Secondary | ICD-10-CM

## 2011-08-27 DIAGNOSIS — F3132 Bipolar disorder, current episode depressed, moderate: Secondary | ICD-10-CM | POA: Diagnosis not present

## 2011-08-27 DIAGNOSIS — F4310 Post-traumatic stress disorder, unspecified: Secondary | ICD-10-CM | POA: Diagnosis not present

## 2011-08-27 DIAGNOSIS — F410 Panic disorder [episodic paroxysmal anxiety] without agoraphobia: Secondary | ICD-10-CM | POA: Diagnosis not present

## 2011-08-31 ENCOUNTER — Ambulatory Visit
Admission: RE | Admit: 2011-08-31 | Discharge: 2011-08-31 | Disposition: A | Discharge status: Home / Self Care | Payer: Medicare Other | Source: Ambulatory Visit | Attending: Neurosurgery | Admitting: Neurosurgery

## 2011-08-31 DIAGNOSIS — M545 Low back pain, unspecified: Secondary | ICD-10-CM | POA: Diagnosis not present

## 2011-08-31 DIAGNOSIS — M549 Dorsalgia, unspecified: Secondary | ICD-10-CM

## 2011-08-31 DIAGNOSIS — M542 Cervicalgia: Secondary | ICD-10-CM

## 2011-08-31 DIAGNOSIS — R209 Unspecified disturbances of skin sensation: Secondary | ICD-10-CM | POA: Diagnosis not present

## 2011-09-03 DIAGNOSIS — F4310 Post-traumatic stress disorder, unspecified: Secondary | ICD-10-CM | POA: Diagnosis not present

## 2011-09-03 DIAGNOSIS — F3132 Bipolar disorder, current episode depressed, moderate: Secondary | ICD-10-CM | POA: Diagnosis not present

## 2011-09-03 DIAGNOSIS — F410 Panic disorder [episodic paroxysmal anxiety] without agoraphobia: Secondary | ICD-10-CM | POA: Diagnosis not present

## 2011-09-09 DIAGNOSIS — L570 Actinic keratosis: Secondary | ICD-10-CM | POA: Diagnosis not present

## 2011-09-09 DIAGNOSIS — D485 Neoplasm of uncertain behavior of skin: Secondary | ICD-10-CM | POA: Diagnosis not present

## 2011-09-16 DIAGNOSIS — M4802 Spinal stenosis, cervical region: Secondary | ICD-10-CM | POA: Diagnosis not present

## 2011-09-16 DIAGNOSIS — M4712 Other spondylosis with myelopathy, cervical region: Secondary | ICD-10-CM | POA: Diagnosis not present

## 2011-09-16 DIAGNOSIS — M542 Cervicalgia: Secondary | ICD-10-CM | POA: Diagnosis not present

## 2011-09-16 DIAGNOSIS — M5412 Radiculopathy, cervical region: Secondary | ICD-10-CM | POA: Diagnosis not present

## 2011-09-17 DIAGNOSIS — F410 Panic disorder [episodic paroxysmal anxiety] without agoraphobia: Secondary | ICD-10-CM | POA: Diagnosis not present

## 2011-09-17 DIAGNOSIS — F4310 Post-traumatic stress disorder, unspecified: Secondary | ICD-10-CM | POA: Diagnosis not present

## 2011-09-17 DIAGNOSIS — F3132 Bipolar disorder, current episode depressed, moderate: Secondary | ICD-10-CM | POA: Diagnosis not present

## 2011-10-01 DIAGNOSIS — F410 Panic disorder [episodic paroxysmal anxiety] without agoraphobia: Secondary | ICD-10-CM | POA: Diagnosis not present

## 2011-10-01 DIAGNOSIS — F3132 Bipolar disorder, current episode depressed, moderate: Secondary | ICD-10-CM | POA: Diagnosis not present

## 2011-10-01 DIAGNOSIS — F4310 Post-traumatic stress disorder, unspecified: Secondary | ICD-10-CM | POA: Diagnosis not present

## 2011-10-08 DIAGNOSIS — F4310 Post-traumatic stress disorder, unspecified: Secondary | ICD-10-CM | POA: Diagnosis not present

## 2011-10-08 DIAGNOSIS — F410 Panic disorder [episodic paroxysmal anxiety] without agoraphobia: Secondary | ICD-10-CM | POA: Diagnosis not present

## 2011-10-08 DIAGNOSIS — F3132 Bipolar disorder, current episode depressed, moderate: Secondary | ICD-10-CM | POA: Diagnosis not present

## 2011-10-11 ENCOUNTER — Other Ambulatory Visit: Payer: Self-pay | Admitting: Neurosurgery

## 2011-10-18 DIAGNOSIS — E109 Type 1 diabetes mellitus without complications: Secondary | ICD-10-CM | POA: Diagnosis not present

## 2011-10-18 DIAGNOSIS — I10 Essential (primary) hypertension: Secondary | ICD-10-CM | POA: Diagnosis not present

## 2011-10-18 DIAGNOSIS — E7800 Pure hypercholesterolemia, unspecified: Secondary | ICD-10-CM | POA: Diagnosis not present

## 2011-10-18 NOTE — Pre-Procedure Instructions (Signed)
20 ARIEA ROCHIN   10/18/2011   Your procedure is scheduled on:  Wednesday, April 10th   Report to [facility] at  6:30 AM.   Call this number if you have problems the morning of surgery: [phone]   Remember:   Do not eat food:After Midnight TUESDAY.  May have clear liquids: up to 4 Hours before arrival time-- 2:30 AM.  Clear liquids include soda, tea, black coffee, apple or grape juice, broth.   Take these medicines the morning of surgery with A SIP OF WATER: CYMBALTA,TYLOX,PRILOSEC,HYDROXYZINE AS NEEDED   Do not wear jewelry, make-up or nail polish.   Do not wear lotions, powders, or perfumes. You may wear deodorant.  Do not shave 48 hours prior to surgery.   Do not bring valuables to the hospital.   Contacts, dentures or bridgework may not be worn into surgery.   Leave suitcase in the car. After surgery it may be brought to your room.  For patients admitted to the hospital, checkout time is 11:00 AM the day of discharge.   Patients discharged the day of surgery will not be allowed to drive home.   Name and phone number of your driver: NA  Special Instructions: CHG Shower Use Special Wash: 1/2 bottle night before surgery and 1/2 bottle morning of surgery.   Please read over the following fact sheets that you were given: Pain Booklet, MRSA Information and Surgical Site Infection Prevention

## 2011-10-19 ENCOUNTER — Other Ambulatory Visit: Payer: Self-pay

## 2011-10-19 ENCOUNTER — Encounter (HOSPITAL_COMMUNITY)
Admission: RE | Admit: 2011-10-19 | Discharge: 2011-10-19 | Disposition: A | Discharge status: Home / Self Care | Payer: Medicare Other | Source: Ambulatory Visit | Attending: Anesthesiology | Admitting: Anesthesiology

## 2011-10-19 ENCOUNTER — Encounter (HOSPITAL_COMMUNITY)
Admission: RE | Admit: 2011-10-19 | Discharge: 2011-10-19 | Disposition: A | Discharge status: Home / Self Care | Payer: Medicare Other | Source: Ambulatory Visit | Attending: Neurosurgery | Admitting: Neurosurgery

## 2011-10-19 ENCOUNTER — Encounter (HOSPITAL_COMMUNITY): Payer: Self-pay | Admitting: Respiratory Therapy

## 2011-10-19 ENCOUNTER — Encounter (HOSPITAL_COMMUNITY): Payer: Self-pay

## 2011-10-19 DIAGNOSIS — F3130 Bipolar disorder, current episode depressed, mild or moderate severity, unspecified: Secondary | ICD-10-CM | POA: Diagnosis not present

## 2011-10-19 DIAGNOSIS — Z01811 Encounter for preprocedural respiratory examination: Secondary | ICD-10-CM | POA: Diagnosis not present

## 2011-10-19 DIAGNOSIS — IMO0001 Reserved for inherently not codable concepts without codable children: Secondary | ICD-10-CM | POA: Diagnosis not present

## 2011-10-19 DIAGNOSIS — I10 Essential (primary) hypertension: Secondary | ICD-10-CM | POA: Diagnosis not present

## 2011-10-19 DIAGNOSIS — R55 Syncope and collapse: Secondary | ICD-10-CM | POA: Diagnosis not present

## 2011-10-19 HISTORY — DX: Carpal tunnel syndrome, unspecified upper limb: G56.00

## 2011-10-19 HISTORY — DX: Presence of insulin pump (external) (internal): Z96.41

## 2011-10-19 HISTORY — DX: Other specified disorders of synovium and tendon, unspecified site: M67.80

## 2011-10-19 HISTORY — DX: Gastro-esophageal reflux disease without esophagitis: K21.9

## 2011-10-19 HISTORY — DX: Bipolar disorder, unspecified: F31.9

## 2011-10-19 HISTORY — DX: Fibromyalgia: M79.7

## 2011-10-19 HISTORY — DX: Osteopetrosis: Q78.2

## 2011-10-19 HISTORY — DX: Anxiety disorder, unspecified: F41.9

## 2011-10-19 HISTORY — DX: Hyperlipidemia, unspecified: E78.5

## 2011-10-19 HISTORY — DX: Essential (primary) hypertension: I10

## 2011-10-19 HISTORY — DX: Palmar fascial fibromatosis (dupuytren): M72.0

## 2011-10-19 HISTORY — DX: Nausea with vomiting, unspecified: R11.2

## 2011-10-19 LAB — BASIC METABOLIC PANEL
BUN: 19 mg/dL (ref 6–23)
CO2: 24 mEq/L (ref 19–32)
Calcium: 9.7 mg/dL (ref 8.4–10.5)
Chloride: 105 mEq/L (ref 96–112)
Creatinine, Ser: 0.67 mg/dL (ref 0.50–1.10)
GFR calc Af Amer: >90 mL/min (ref >90)
GFR calc non Af Amer: >90 mL/min (ref >90)
Glucose, Bld: 104 mg/dL — ABNORMAL HIGH (ref 70–99)
Potassium: 3.9 mEq/L (ref 3.5–5.1)
Sodium: 139 mEq/L (ref 135–145)

## 2011-10-19 LAB — CBC
HCT: 43.4 % (ref 36.0–46.0)
Hemoglobin: 14.8 g/dL (ref 12.0–15.0)
MCH: 30 pg (ref 26.0–34.0)
MCHC: 34.1 g/dL (ref 30.0–36.0)
MCV: 88 fL (ref 78.0–100.0)
Platelets: 314 10*3/uL (ref 150–400)
RBC: 4.93 MIL/uL (ref 3.87–5.11)
RDW: 12.4 % (ref 11.5–15.5)
WBC: 9.7 10*3/uL (ref 4.0–10.5)

## 2011-10-19 LAB — SURGICAL PCR SCREEN
MRSA, PCR: NEGATIVE
Staphylococcus aureus: NEGATIVE
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
## 2011-10-25 DIAGNOSIS — F3132 Bipolar disorder, current episode depressed, moderate: Secondary | ICD-10-CM | POA: Diagnosis not present

## 2011-10-25 DIAGNOSIS — F4310 Post-traumatic stress disorder, unspecified: Secondary | ICD-10-CM | POA: Diagnosis not present

## 2011-10-25 DIAGNOSIS — F410 Panic disorder [episodic paroxysmal anxiety] without agoraphobia: Secondary | ICD-10-CM | POA: Diagnosis not present

## 2011-10-26 MED ORDER — CEFAZOLIN SODIUM 1-5 GM-% IV SOLN
1.0000 g | INTRAVENOUS | Status: AC
  Administered 2011-10-27: 1 g via INTRAVENOUS

## 2011-10-27 ENCOUNTER — Encounter (HOSPITAL_COMMUNITY): Payer: Self-pay | Admitting: Vascular Surgery

## 2011-10-27 ENCOUNTER — Inpatient Hospital Stay (HOSPITAL_COMMUNITY): Payer: Medicare Other | Admitting: Vascular Surgery

## 2011-10-27 ENCOUNTER — Encounter (HOSPITAL_COMMUNITY)
Admission: RE | Disposition: A | Discharge status: Home / Self Care | Payer: Self-pay | Source: Ambulatory Visit | Attending: Neurosurgery

## 2011-10-27 ENCOUNTER — Encounter (HOSPITAL_COMMUNITY): Payer: Self-pay | Admitting: General Practice

## 2011-10-27 ENCOUNTER — Inpatient Hospital Stay (HOSPITAL_COMMUNITY): Payer: Medicare Other

## 2011-10-27 ENCOUNTER — Inpatient Hospital Stay (HOSPITAL_COMMUNITY)
Admission: RE | Admit: 2011-10-27 | Discharge: 2011-10-29 | DRG: 472 | Disposition: A | Discharge status: Home / Self Care | Payer: Medicare Other | Source: Ambulatory Visit | Attending: Neurosurgery | Admitting: Neurosurgery

## 2011-10-27 DIAGNOSIS — F17200 Nicotine dependence, unspecified, uncomplicated: Secondary | ICD-10-CM | POA: Diagnosis present

## 2011-10-27 DIAGNOSIS — IMO0001 Reserved for inherently not codable concepts without codable children: Secondary | ICD-10-CM | POA: Diagnosis present

## 2011-10-27 DIAGNOSIS — F319 Bipolar disorder, unspecified: Secondary | ICD-10-CM | POA: Diagnosis not present

## 2011-10-27 DIAGNOSIS — Z01810 Encounter for preprocedural cardiovascular examination: Secondary | ICD-10-CM

## 2011-10-27 DIAGNOSIS — Z79899 Other long term (current) drug therapy: Secondary | ICD-10-CM

## 2011-10-27 DIAGNOSIS — M810 Age-related osteoporosis without current pathological fracture: Secondary | ICD-10-CM | POA: Diagnosis present

## 2011-10-27 DIAGNOSIS — Z981 Arthrodesis status: Secondary | ICD-10-CM | POA: Diagnosis not present

## 2011-10-27 DIAGNOSIS — F341 Dysthymic disorder: Secondary | ICD-10-CM | POA: Diagnosis present

## 2011-10-27 DIAGNOSIS — M5000 Cervical disc disorder with myelopathy, unspecified cervical region: Secondary | ICD-10-CM | POA: Diagnosis not present

## 2011-10-27 DIAGNOSIS — Z01812 Encounter for preprocedural laboratory examination: Secondary | ICD-10-CM

## 2011-10-27 DIAGNOSIS — H9190 Unspecified hearing loss, unspecified ear: Secondary | ICD-10-CM | POA: Diagnosis present

## 2011-10-27 DIAGNOSIS — I10 Essential (primary) hypertension: Secondary | ICD-10-CM | POA: Diagnosis present

## 2011-10-27 DIAGNOSIS — M4712 Other spondylosis with myelopathy, cervical region: Principal | ICD-10-CM | POA: Diagnosis present

## 2011-10-27 DIAGNOSIS — Z794 Long term (current) use of insulin: Secondary | ICD-10-CM

## 2011-10-27 DIAGNOSIS — M542 Cervicalgia: Secondary | ICD-10-CM | POA: Diagnosis not present

## 2011-10-27 DIAGNOSIS — R112 Nausea with vomiting, unspecified: Secondary | ICD-10-CM

## 2011-10-27 DIAGNOSIS — M5412 Radiculopathy, cervical region: Secondary | ICD-10-CM | POA: Diagnosis not present

## 2011-10-27 DIAGNOSIS — Z9641 Presence of insulin pump (external) (internal): Secondary | ICD-10-CM

## 2011-10-27 DIAGNOSIS — E109 Type 1 diabetes mellitus without complications: Secondary | ICD-10-CM | POA: Diagnosis present

## 2011-10-27 DIAGNOSIS — E785 Hyperlipidemia, unspecified: Secondary | ICD-10-CM | POA: Diagnosis not present

## 2011-10-27 DIAGNOSIS — M79609 Pain in unspecified limb: Secondary | ICD-10-CM | POA: Diagnosis not present

## 2011-10-27 DIAGNOSIS — Z01818 Encounter for other preprocedural examination: Secondary | ICD-10-CM

## 2011-10-27 DIAGNOSIS — K219 Gastro-esophageal reflux disease without esophagitis: Secondary | ICD-10-CM | POA: Diagnosis present

## 2011-10-27 DIAGNOSIS — M4802 Spinal stenosis, cervical region: Secondary | ICD-10-CM | POA: Diagnosis not present

## 2011-10-27 HISTORY — DX: Sleep apnea, unspecified: G47.30

## 2011-10-27 HISTORY — DX: Major depressive disorder, single episode, unspecified: F32.9

## 2011-10-27 HISTORY — DX: Cardiac murmur, unspecified: R01.1

## 2011-10-27 HISTORY — DX: Other chronic pain: G89.29

## 2011-10-27 HISTORY — DX: Nausea with vomiting, unspecified: R11.2

## 2011-10-27 HISTORY — DX: Personal history of other diseases of the respiratory system: Z87.09

## 2011-10-27 HISTORY — DX: Unspecified hearing loss, unspecified ear: H91.90

## 2011-10-27 HISTORY — DX: Low back pain, unspecified: M54.50

## 2011-10-27 HISTORY — DX: Spondylosis without myelopathy or radiculopathy, cervical region: M47.812

## 2011-10-27 HISTORY — DX: Type 1 diabetes mellitus with other specified complication: E10.69

## 2011-10-27 HISTORY — DX: Other specified postprocedural states: Z98.890

## 2011-10-27 HISTORY — DX: Type 1 diabetes mellitus with hyperglycemia: E10.65

## 2011-10-27 HISTORY — PX: ANTERIOR CERVICAL DECOMP/DISCECTOMY FUSION: SHX1161

## 2011-10-27 HISTORY — DX: Low back pain: M54.5

## 2011-10-27 HISTORY — DX: Pneumonia, unspecified organism: J18.9

## 2011-10-27 HISTORY — DX: Type 2 diabetes mellitus with diabetic neuropathy, unspecified: E11.40

## 2011-10-27 HISTORY — DX: Bipolar disorder, unspecified: F31.9

## 2011-10-27 HISTORY — DX: Malignant (primary) neoplasm, unspecified: C80.1

## 2011-10-27 HISTORY — DX: Headache: R51

## 2011-10-27 LAB — GLUCOSE, CAPILLARY
Glucose-Capillary: 167 mg/dL — ABNORMAL HIGH (ref 70–99)
Glucose-Capillary: 220 mg/dL — ABNORMAL HIGH (ref 70–99)

## 2011-10-27 SURGERY — ANTERIOR CERVICAL DECOMPRESSION/DISCECTOMY FUSION 2 LEVELS
Anesthesia: General | Wound class: Clean

## 2011-10-27 MED ORDER — SODIUM CHLORIDE 0.9 % IV SOLN
INTRAVENOUS | Status: AC
  Filled 2011-10-27: qty 500

## 2011-10-27 MED ORDER — PHENYLEPHRINE HCL 10 MG/ML IJ SOLN
INTRAMUSCULAR | Status: DC | PRN
  Administered 2011-10-27: 40 ug via INTRAVENOUS
  Administered 2011-10-27: 80 ug via INTRAVENOUS

## 2011-10-27 MED ORDER — MEPERIDINE HCL 25 MG/ML IJ SOLN: 6.2500 mg | INTRAMUSCULAR | Status: DC | PRN

## 2011-10-27 MED ORDER — PROMETHAZINE HCL 25 MG/ML IJ SOLN
INTRAMUSCULAR | Status: AC
  Filled 2011-10-27: qty 1

## 2011-10-27 MED ORDER — LACTATED RINGERS IV SOLN
INTRAVENOUS | Status: DC | PRN
  Administered 2011-10-27 (×3): via INTRAVENOUS

## 2011-10-27 MED ORDER — DEXAMETHASONE 4 MG PO TABS
4.0000 mg | ORAL_TABLET | Freq: Four times a day (QID) | ORAL | Status: AC
  Administered 2011-10-27: 4 mg via ORAL
  Filled 2011-10-27 (×2): qty 1

## 2011-10-27 MED ORDER — PROMETHAZINE HCL 25 MG/ML IJ SOLN
6.2500 mg | INTRAMUSCULAR | Status: DC | PRN
  Administered 2011-10-27: 12.5 mg via INTRAVENOUS

## 2011-10-27 MED ORDER — DOCUSATE SODIUM 100 MG PO CAPS
100.0000 mg | ORAL_CAPSULE | Freq: Two times a day (BID) | ORAL | Status: DC
  Administered 2011-10-27 – 2011-10-28 (×3): 100 mg via ORAL
  Filled 2011-10-27 (×4): qty 1

## 2011-10-27 MED ORDER — DEXAMETHASONE SODIUM PHOSPHATE 10 MG/ML IJ SOLN
INTRAMUSCULAR | Status: DC | PRN
  Administered 2011-10-27: 4 mg via INTRAVENOUS

## 2011-10-27 MED ORDER — HEMOSTATIC AGENTS (NO CHARGE) OPTIME
TOPICAL | Status: DC | PRN
  Administered 2011-10-27: 1 via TOPICAL

## 2011-10-27 MED ORDER — MENTHOL 3 MG MT LOZG: 1.0000 | LOZENGE | OROMUCOSAL | Status: DC | PRN

## 2011-10-27 MED ORDER — TAPENTADOL HCL 50 MG PO TABS
50.0000 mg | ORAL_TABLET | ORAL | Status: DC | PRN
  Administered 2011-10-27 – 2011-10-29 (×4): 50 mg via ORAL
  Filled 2011-10-27 (×4): qty 1

## 2011-10-27 MED ORDER — OMEPRAZOLE MAGNESIUM 20 MG PO TBEC: 20.0000 mg | DELAYED_RELEASE_TABLET | Freq: Every day | ORAL | Status: DC

## 2011-10-27 MED ORDER — DEXAMETHASONE SODIUM PHOSPHATE 4 MG/ML IJ SOLN
4.0000 mg | Freq: Four times a day (QID) | INTRAMUSCULAR | Status: AC
  Administered 2011-10-27: 4 mg via INTRAVENOUS
  Filled 2011-10-27: qty 1

## 2011-10-27 MED ORDER — ACETAMINOPHEN 325 MG PO TABS: 650.0000 mg | ORAL_TABLET | ORAL | Status: DC | PRN

## 2011-10-27 MED ORDER — IRBESARTAN 150 MG PO TABS
150.0000 mg | ORAL_TABLET | Freq: Every day | ORAL | Status: DC
  Administered 2011-10-27 – 2011-10-28 (×2): 150 mg via ORAL
  Filled 2011-10-27 (×3): qty 1

## 2011-10-27 MED ORDER — SODIUM CHLORIDE 0.9 % IR SOLN
Status: DC | PRN
  Administered 2011-10-27: 09:00:00

## 2011-10-27 MED ORDER — CLONAZEPAM 0.5 MG PO TABS
0.2500 mg | ORAL_TABLET | Freq: Three times a day (TID) | ORAL | Status: DC | PRN
  Administered 2011-10-28: 0.25 mg via ORAL
  Filled 2011-10-27: qty 1

## 2011-10-27 MED ORDER — PANTOPRAZOLE SODIUM 40 MG PO TBEC
40.0000 mg | DELAYED_RELEASE_TABLET | Freq: Every day | ORAL | Status: DC
  Administered 2011-10-28: 40 mg via ORAL
  Filled 2011-10-27: qty 1

## 2011-10-27 MED ORDER — ACETAMINOPHEN 325 MG PO TABS: 325.0000 mg | ORAL_TABLET | ORAL | Status: DC | PRN

## 2011-10-27 MED ORDER — FENTANYL CITRATE 0.05 MG/ML IJ SOLN: 25.0000 ug | INTRAMUSCULAR | Status: DC | PRN

## 2011-10-27 MED ORDER — BUPIVACAINE-EPINEPHRINE PF 0.5-1:200000 % IJ SOLN
INTRAMUSCULAR | Status: DC | PRN
  Administered 2011-10-27: 10 mL

## 2011-10-27 MED ORDER — PHENOL 1.4 % MT LIQD: 1.0000 | OROMUCOSAL | Status: DC | PRN

## 2011-10-27 MED ORDER — PROPOFOL 10 MG/ML IV EMUL
INTRAVENOUS | Status: DC | PRN
  Administered 2011-10-27: 200 mg via INTRAVENOUS

## 2011-10-27 MED ORDER — LACTATED RINGERS IV SOLN
INTRAVENOUS | Status: DC
Start: 2011-10-27 — End: 2011-10-29

## 2011-10-27 MED ORDER — LIDOCAINE HCL (CARDIAC) 20 MG/ML IV SOLN
INTRAVENOUS | Status: DC | PRN
  Administered 2011-10-27: 60 mg via INTRAVENOUS

## 2011-10-27 MED ORDER — GLYCOPYRROLATE 0.2 MG/ML IJ SOLN
INTRAMUSCULAR | Status: DC | PRN
  Administered 2011-10-27: .5 mg via INTRAVENOUS

## 2011-10-27 MED ORDER — 0.9 % SODIUM CHLORIDE (POUR BTL) OPTIME
TOPICAL | Status: DC | PRN
  Administered 2011-10-27: 1000 mL

## 2011-10-27 MED ORDER — CEFAZOLIN SODIUM 1-5 GM-% IV SOLN
1.0000 g | Freq: Three times a day (TID) | INTRAVENOUS | Status: AC
  Administered 2011-10-27 – 2011-10-28 (×2): 1 g via INTRAVENOUS
  Filled 2011-10-27 (×2): qty 50

## 2011-10-27 MED ORDER — DIAZEPAM 5 MG PO TABS
5.0000 mg | ORAL_TABLET | Freq: Four times a day (QID) | ORAL | Status: DC | PRN
  Administered 2011-10-27: 5 mg via ORAL
  Filled 2011-10-27: qty 1

## 2011-10-27 MED ORDER — ONDANSETRON HCL 4 MG/2ML IJ SOLN
4.0000 mg | INTRAMUSCULAR | Status: DC | PRN
  Administered 2011-10-27 – 2011-10-29 (×7): 4 mg via INTRAVENOUS
  Filled 2011-10-27 (×7): qty 2

## 2011-10-27 MED ORDER — INSULIN ASPART 100 UNIT/ML ~~LOC~~ SOLN
0.0000 [IU] | SUBCUTANEOUS | Status: DC
  Administered 2011-10-27: 7.5 [IU] via SUBCUTANEOUS
  Administered 2011-10-28: 7 [IU] via SUBCUTANEOUS
  Administered 2011-10-28: 7.2 [IU] via SUBCUTANEOUS
  Administered 2011-10-28: 15 [IU] via SUBCUTANEOUS

## 2011-10-27 MED ORDER — ACETAMINOPHEN 650 MG RE SUPP: 650.0000 mg | RECTAL | Status: DC | PRN

## 2011-10-27 MED ORDER — BACITRACIN 50000 UNITS IM SOLR
INTRAMUSCULAR | Status: AC
  Filled 2011-10-27: qty 1

## 2011-10-27 MED ORDER — MIDAZOLAM HCL 2 MG/2ML IJ SOLN: 0.5000 mg | Freq: Once | INTRAMUSCULAR | Status: DC | PRN

## 2011-10-27 MED ORDER — ONDANSETRON HCL 4 MG/2ML IJ SOLN
INTRAMUSCULAR | Status: DC | PRN
  Administered 2011-10-27 (×2): 4 mg via INTRAVENOUS

## 2011-10-27 MED ORDER — MORPHINE SULFATE 2 MG/ML IJ SOLN
2.0000 mg | INTRAMUSCULAR | Status: DC | PRN
  Administered 2011-10-28 (×4): 2 mg via INTRAVENOUS
  Filled 2011-10-27 (×4): qty 1

## 2011-10-27 MED ORDER — ROCURONIUM BROMIDE 100 MG/10ML IV SOLN
INTRAVENOUS | Status: DC | PRN
  Administered 2011-10-27 (×2): 10 mg via INTRAVENOUS
  Administered 2011-10-27: 50 mg via INTRAVENOUS

## 2011-10-27 MED ORDER — NEOSTIGMINE METHYLSULFATE 1 MG/ML IJ SOLN
INTRAMUSCULAR | Status: DC | PRN
  Administered 2011-10-27: 4 mg via INTRAVENOUS

## 2011-10-27 MED ORDER — THROMBIN 5000 UNITS EX SOLR
CUTANEOUS | Status: DC | PRN
  Administered 2011-10-27 (×2): 5000 [IU] via TOPICAL

## 2011-10-27 MED ORDER — EPHEDRINE SULFATE 50 MG/ML IJ SOLN
INTRAMUSCULAR | Status: DC | PRN
  Administered 2011-10-27: 5 mg via INTRAVENOUS

## 2011-10-27 MED ORDER — SIMVASTATIN 40 MG PO TABS
40.0000 mg | ORAL_TABLET | Freq: Every day | ORAL | Status: DC
  Administered 2011-10-27 – 2011-10-28 (×2): 40 mg via ORAL
  Filled 2011-10-27 (×3): qty 1

## 2011-10-27 MED ORDER — ZOLPIDEM TARTRATE 10 MG PO TABS
10.0000 mg | ORAL_TABLET | Freq: Every evening | ORAL | Status: DC | PRN
  Administered 2011-10-28: 10 mg via ORAL
  Filled 2011-10-27: qty 1

## 2011-10-27 MED ORDER — HYDROXYZINE HCL 25 MG PO TABS: 25.0000 mg | ORAL_TABLET | Freq: Four times a day (QID) | ORAL | Status: DC | PRN

## 2011-10-27 MED ORDER — SUFENTANIL CITRATE 50 MCG/ML IV SOLN
INTRAVENOUS | Status: DC | PRN
  Administered 2011-10-27: 10 ug via INTRAVENOUS
  Administered 2011-10-27: 30 ug via INTRAVENOUS

## 2011-10-27 MED ORDER — MIDAZOLAM HCL 5 MG/5ML IJ SOLN
INTRAMUSCULAR | Status: DC | PRN
  Administered 2011-10-27: 2 mg via INTRAVENOUS

## 2011-10-27 MED ORDER — DULOXETINE HCL 60 MG PO CPEP
120.0000 mg | ORAL_CAPSULE | Freq: Every day | ORAL | Status: DC
  Administered 2011-10-28: 120 mg via ORAL
  Filled 2011-10-27 (×2): qty 2

## 2011-10-27 MED ORDER — LIDOCAINE HCL 4 % MT SOLN
OROMUCOSAL | Status: DC | PRN
  Administered 2011-10-27: 4 mL via TOPICAL

## 2011-10-27 SURGICAL SUPPLY — 68 items
APL SKNCLS STERI-STRIP NONHPOA (GAUZE/BANDAGES/DRESSINGS) ×2
BAG DECANTER FOR FLEXI CONT (MISCELLANEOUS) ×2 IMPLANT
BENZOIN TINCTURE PRP APPL 2/3 (GAUZE/BANDAGES/DRESSINGS) ×4 IMPLANT
BIT DRILL SPINE QC 12 (BIT) ×1 IMPLANT
BLADE SURG 15 STRL LF DISP TIS (BLADE) ×1 IMPLANT
BLADE SURG 15 STRL SS (BLADE) ×2
BLADE ULTRA TIP 2M (BLADE) ×2 IMPLANT
BRUSH SCRUB EZ PLAIN DRY (MISCELLANEOUS) ×2 IMPLANT
BUR BARREL STRAIGHT FLUTE 4.0 (BURR) ×2 IMPLANT
BUR MATCHSTICK NEURO 3.0 LAGG (BURR) ×2 IMPLANT
CANISTER SUCTION 2500CC (MISCELLANEOUS) ×2 IMPLANT
CLIP TI MEDIUM 6 (CLIP) ×1 IMPLANT
CLOTH BEACON ORANGE TIMEOUT ST (SAFETY) ×2 IMPLANT
CONT SPEC 4OZ CLIKSEAL STRL BL (MISCELLANEOUS) ×2 IMPLANT
COVER MAYO STAND STRL (DRAPES) ×2 IMPLANT
DEVICE FUSION VIST S 14X14X6MM (Trauma) IMPLANT
DRAPE LAPAROTOMY 100X72 PEDS (DRAPES) ×2 IMPLANT
DRAPE MICROSCOPE LEICA (MISCELLANEOUS) IMPLANT
DRAPE POUCH INSTRU U-SHP 10X18 (DRAPES) ×2 IMPLANT
DRAPE SURG 17X23 STRL (DRAPES) ×4 IMPLANT
ELECT REM PT RETURN 9FT ADLT (ELECTROSURGICAL) ×2
ELECTRODE REM PT RTRN 9FT ADLT (ELECTROSURGICAL) ×1 IMPLANT
GAUZE SPONGE 4X4 16PLY XRAY LF (GAUZE/BANDAGES/DRESSINGS) IMPLANT
GLOVE BIO SURGEON STRL SZ8.5 (GLOVE) ×2 IMPLANT
GLOVE BIOGEL M 8.0 STRL (GLOVE) ×1 IMPLANT
GLOVE BIOGEL PI IND STRL 7.0 (GLOVE) IMPLANT
GLOVE BIOGEL PI IND STRL 8 (GLOVE) IMPLANT
GLOVE BIOGEL PI INDICATOR 7.0 (GLOVE) ×1
GLOVE BIOGEL PI INDICATOR 8 (GLOVE) ×1
GLOVE ECLIPSE 7.5 STRL STRAW (GLOVE) ×2 IMPLANT
GLOVE EXAM NITRILE LRG STRL (GLOVE) IMPLANT
GLOVE EXAM NITRILE MD LF STRL (GLOVE) IMPLANT
GLOVE EXAM NITRILE XL STR (GLOVE) IMPLANT
GLOVE EXAM NITRILE XS STR PU (GLOVE) IMPLANT
GLOVE SS BIOGEL STRL SZ 8 (GLOVE) ×1 IMPLANT
GLOVE SUPERSENSE BIOGEL SZ 8 (GLOVE) ×1
GLOVE SURG SS PI 6.5 STRL IVOR (GLOVE) ×2 IMPLANT
GOWN BRE IMP SLV AUR LG STRL (GOWN DISPOSABLE) ×1 IMPLANT
GOWN BRE IMP SLV AUR XL STRL (GOWN DISPOSABLE) ×3 IMPLANT
KIT BASIN OR (CUSTOM PROCEDURE TRAY) ×2 IMPLANT
KIT ROOM TURNOVER OR (KITS) ×2 IMPLANT
MARKER SKIN DUAL TIP RULER LAB (MISCELLANEOUS) ×2 IMPLANT
NDL SPNL 18GX3.5 QUINCKE PK (NEEDLE) ×1 IMPLANT
NEEDLE HYPO 22GX1.5 SAFETY (NEEDLE) ×2 IMPLANT
NEEDLE SPNL 18GX3.5 QUINCKE PK (NEEDLE) ×2 IMPLANT
NS IRRIG 1000ML POUR BTL (IV SOLUTION) ×2 IMPLANT
PACK LAMINECTOMY NEURO (CUSTOM PROCEDURE TRAY) ×2 IMPLANT
PATTIES SURGICAL .5 X.5 (GAUZE/BANDAGES/DRESSINGS) ×1 IMPLANT
PATTIES SURGICAL 1X1 (DISPOSABLE) ×1 IMPLANT
PIN DISTRACTION 14MM (PIN) ×4 IMPLANT
PLATE ANT CERV XTEND 2 LV 28 (Plate) ×1 IMPLANT
PUTTY 5ML ACTIFUSE ABX (Putty) ×1 IMPLANT
RUBBERBAND STERILE (MISCELLANEOUS) ×2 IMPLANT
SCREW XTD VAR 4.2 SELF TAP 12 (Screw) ×6 IMPLANT
SPONGE GAUZE 4X4 12PLY (GAUZE/BANDAGES/DRESSINGS) ×2 IMPLANT
SPONGE INTESTINAL PEANUT (DISPOSABLE) ×4 IMPLANT
SPONGE SURGIFOAM ABS GEL SZ50 (HEMOSTASIS) ×2 IMPLANT
STRIP CLOSURE SKIN 1/2X4 (GAUZE/BANDAGES/DRESSINGS) ×2 IMPLANT
SUT VIC AB 0 CT1 27 (SUTURE) ×2
SUT VIC AB 0 CT1 27XBRD ANTBC (SUTURE) ×1 IMPLANT
SUT VIC AB 3-0 SH 8-18 (SUTURE) ×2 IMPLANT
SYR 20ML ECCENTRIC (SYRINGE) ×2 IMPLANT
TAPE CLOTH SURG 4X10 WHT LF (GAUZE/BANDAGES/DRESSINGS) ×1 IMPLANT
TOWEL OR 17X24 6PK STRL BLUE (TOWEL DISPOSABLE) ×2 IMPLANT
TOWEL OR 17X26 10 PK STRL BLUE (TOWEL DISPOSABLE) ×2 IMPLANT
VISTA S 14X14X7 (Spacer) ×1 IMPLANT
VISTA S O 14X14X6MM (Trauma) ×2 IMPLANT
WATER STERILE IRR 1000ML POUR (IV SOLUTION) ×2 IMPLANT

## 2011-10-27 NOTE — OR Nursing (Signed)
One blue and clear hearing aid in a cup with lid and pt sticker on lid, also pair of glasses red and square, both given to volunteer Creta Levin at [phone]. Friends and family were not in waiting area at time.

## 2011-10-27 NOTE — Op Note (Signed)
Brief history: The patient is a 56 year old white female who is complaining of neck shoulder and arm pain consistent with a cervical radiculopathy/myelopathy. She has failed medical management and was worked up with cervical MRI. This demonstrated patient has disc degeneration, spondylosis, stenosis at C5-6 and C6-7. I discussed the various treatment options with the patient including surgery. The patient has weighed the risks, benefits, and alternatives surgery and like to proceed with C5-6 and C6-7 anterior cervicectomy fusion and plating.  Preoperative diagnosis: C5-6 and C6-7 disc degeneration, spondylosis, stenosis, cervical radiculopathy/myelopathy, cervicalgia  Postoperative diagnosis: The same  Procedure: C5-6 and C6-7 Anterior cervical discectomy/decompression; C5-6 and C6-7 interbody arthrodesis with local morcellized autograft bone and Actifuse bone graft extender; insertion of interbody prosthesis at C5-6 and C6-7 (Zimmer peek interbody prosthesis); anterior cervical plating from C5-C7 with globus titanium plate  Surgeon: Dr. Delma Officer  Asst.: Dr. Hilda Lias  Anesthesia: Gen. endotracheal  Estimated blood loss: 100 cc  Drains: None  Complications: None  Description of procedure: The patient was brought to the operating room by the anesthesia team. General endotracheal anesthesia was induced. A roll was placed under the patient's shoulders to keep the neck in the neutral position. The patient's anterior cervical region was then prepared with Betadine scrub and Betadine solution. Sterile drapes were applied.  The area to be incised was then injected with Marcaine with epinephrine solution. I then used a scalpel to make a transverse incision in the patient's left anterior neck. I used the Metzenbaum scissors to divide the platysmal muscle and then to dissect medial to the sternocleidomastoid muscle, jugular vein, and carotid artery. I carefully dissected down towards the  anterior cervical spine identifying the esophagus and retracting it medially. Then using Kitner swabs to clear soft tissue from the anterior cervical spine. We then inserted a bent spinal needle into the upper exposed intervertebral disc space. We then obtained intraoperative radiographs confirm our location.  I then used electrocautery to detach the medial border of the longus colli muscle bilaterally from the C5-6 and C6-7 intervertebral disc spaces. I then inserted the Caspar self-retaining retractor underneath the longus colli muscle bilaterally to provide exposure.  We then incised the intervertebral disc at C5-6. We then performed a partial intervertebral discectomy with a pituitary forceps and the Karlin curettes. I then inserted distraction screws into the vertebral bodies at C5 and C6. We then distracted the interspace. We then used the high-speed drill to decorticate the vertebral endplates at C5-6, to drill away the remainder of the intervertebral disc, to drill away some posterior spondylosis, and to thin out the posterior longitudinal ligament. I then incised ligament with the arachnoid knife. We then removed the ligament with a Kerrison punches undercutting the vertebral endplates and decompressing the thecal sac. We then performed foraminotomies about the bilateral C6 nerve roots. This completed the decompression at this level.  We then repeated this procedure in an analogous fashion at C6-7 decompressing the C6-7 thecal sac and the bilateral C7 nerve roots.  We now turned our to attention to the interbody fusion. We used the trial spacers to determine the appropriate size for the interbody prosthesis. We then pre-filled prosthesis with a combination of local morcellized autograft bone that we obtained during decompression as well as Actifuse bone graft extender. We then inserted the prosthesis into the distracted interspace at C5-6 and C6-7. We then removed the distraction screws. There was a  good snug fit of the prosthesis in the interspace.  Having completed the fusion  we now turned attention to the anterior spinal instrumentation. We used the high-speed drill to drill away some anterior spondylosis at the disc spaces so that the plate lay down flat. We selected the appropriate length titanium anterior cervical plate. We laid it along the anterior aspect of the vertebral bodies from C5-C7. We then drilled 12 mm holes at C5, C6 and C7. We then secured the plate to the vertebral bodies by placing two 12 mm self-tapping screws at C5, C6 and C7. We then obtained intraoperative radiograph. The demonstrating good position of the instrumentation. We therefore secured the screws the plate the locking each cam. This completed the instrumentation.  We then obtained hemostasis using bipolar electrocautery. We irrigated the wound out with bacitracin solution. We then removed the retractor. We inspected the esophagus for any damage. There was none apparent. We then reapproximated patient's platysmal muscle with interrupted 3-0 Vicryl suture. We then reapproximated the subcutaneous tissue with interrupted 3-0 Vicryl suture. The skin was reapproximated with Steri-Strips and benzoin. The wound was then covered with bacitracin ointment. A sterile dressing was applied. The drapes were removed. Patient was subsequently extubated by the anesthesia team and transported to the post anesthesia care unit in stable condition. All sponge instrument and needle counts were correct at the end of this case.

## 2011-10-27 NOTE — Preoperative (Signed)
Beta Blockers   Reason not to administer Beta Blockers:Not Applicable 

## 2011-10-27 NOTE — Anesthesia Preprocedure Evaluation (Addendum)
Anesthesia Evaluation  Patient identified by MRN, date of birth, ID band Patient awake    Reviewed: Allergy & Precautions, H&P , NPO status , Patient's Chart, lab work & pertinent test results, reviewed documented beta blocker date and time   History of Anesthesia Complications Negative for: history of anesthetic complications  Airway Mallampati: II TM Distance: >3 FB Neck ROM: full    Dental No notable dental hx. (+) Dental Advisory Given and Teeth Intact   Pulmonary neg pulmonary ROS, Current Smoker (1/2 ppd x 40 years),  breath sounds clear to auscultation  Pulmonary exam normal       Cardiovascular Exercise Tolerance: Good hypertension, Pt. on medications negative cardio ROS  Rhythm:regular Rate:Normal     Neuro/Psych PSYCHIATRIC DISORDERS Anxiety Depression  Neuromuscular disease negative neurological ROS  negative psych ROS   GI/Hepatic negative GI ROS, Neg liver ROS, GERD-  Medicated and Controlled,  Endo/Other  negative endocrine ROSDiabetes mellitus-, Type 1, Insulin DependentDM type I diagnosed @ 56 years old; pt currently on insulin pump  Renal/GU negative Renal ROS     Musculoskeletal  (+) Fibromyalgia -  Abdominal   Peds  Hematology negative hematology ROS (+)   Anesthesia Other Findings   Reproductive/Obstetrics negative OB ROS                         Anesthesia Physical Anesthesia Plan  ASA: III  Anesthesia Plan: General ETT   Post-op Pain Management:    Induction:   Airway Management Planned:   Additional Equipment:   Intra-op Plan:   Post-operative Plan:   Informed Consent: I have reviewed the patients History and Physical, chart, labs and discussed the procedure including the risks, benefits and alternatives for the proposed anesthesia with the patient or authorized representative who has indicated his/her understanding and acceptance.   Dental Advisory  Given  Plan Discussed with: CRNA and Surgeon  Anesthesia Plan Comments:         Anesthesia Quick Evaluation
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
## 2011-10-27 NOTE — Anesthesia Procedure Notes (Signed)
Procedure Name: Intubation Date/Time: 10/27/2011 8:41 AM Performed by: Lovie Chol Pre-anesthesia Checklist: Patient identified, Emergency Drugs available, Suction available, Patient being monitored and Timeout performed Patient Re-evaluated:Patient Re-evaluated prior to inductionOxygen Delivery Method: Circle system utilized Preoxygenation: Pre-oxygenation with 100% oxygen Intubation Type: IV induction Ventilation: Mask ventilation without difficulty Laryngoscope Size: Miller and 2 Grade View: Grade I Tube type: Oral Tube size: 7.5 mm Number of attempts: 1 Airway Equipment and Method: Stylet and LTA kit utilized Placement Confirmation: ETT inserted through vocal cords under direct vision,  positive ETCO2 and breath sounds checked- equal and bilateral Secured at: 21 cm Tube secured with: Tape Dental Injury: Teeth and Oropharynx as per pre-operative assessment

## 2011-10-27 NOTE — Transfer of Care (Signed)
Immediate Anesthesia Transfer of Care Note  Patient: Brooke Avery  Procedure(s) Performed: Procedure(s) (LRB): ANTERIOR CERVICAL DECOMPRESSION/DISCECTOMY FUSION 2 LEVELS (N/A)  Patient Location: PACU  Anesthesia Type: General  Level of Consciousness: awake, alert , oriented and patient cooperative  Airway & Oxygen Therapy: Patient Spontanous Breathing and Patient connected to nasal cannula oxygen  Post-op Assessment: Report given to PACU RN, Post -op Vital signs reviewed and stable and Patient moving all extremities  Post vital signs: Reviewed and stable  Complications: No apparent anesthesia complications

## 2011-10-27 NOTE — Progress Notes (Signed)
Subjective:  The patient is alert and pleasant. She has some postop nausea and vomiting. Otherwise she looks well.  Objective: Vital signs in last 24 hours: Temp:  [97.2 F (36.2 C)] 97.2 F (36.2 C) (04/10 1144) Pulse Rate:  [93-101] 93  (04/10 1215) Resp:  [15-20] 19  (04/10 1215) BP: (169-196)/(66-85) 169/66 mmHg (04/10 1200) SpO2:  [91 %-98 %] 96 % (04/10 1215)  Intake/Output from previous day:   Intake/Output this shift: Total I/O In: 2200 [I.V.:2200] Out: 100 [Blood:100]  Physical exam the patient is alert and oriented. She is moving all 4 extremities well. Her dressing is clean and dry without evidence of hematoma or shift.  Lab Results: No results found for this basename: WBC:2,HGB:2,HCT:2,PLT:2 in the last 72 hours BMET No results found for this basename: NA:2,K:2,CL:2,CO2:2,GLUCOSE:2,BUN:2,CREATININE:2,CALCIUM:2 in the last 72 hours  Studies/Results: No results found.  Assessment/Plan: Patient is doing well except for nausea and vomiting and she's getting medication for this.  LOS: 0 days     Saavi Mceachron D 10/27/2011, 12:25 PM

## 2011-10-27 NOTE — Anesthesia Postprocedure Evaluation (Signed)
Anesthesia Post Note  Patient: Brooke Avery  Procedure(s) Performed: Procedure(s) (LRB): ANTERIOR CERVICAL DECOMPRESSION/DISCECTOMY FUSION 2 LEVELS (N/A)  Anesthesia type: GA  Patient location: PACU  Post pain: Pain level controlled  Post assessment: Post-op Vital signs reviewed  Last Vitals:  Filed Vitals:   10/27/11 1215  BP:   Pulse: 93  Temp:   Resp: 19    Post vital signs: Reviewed  Level of consciousness: sedated  Complications: No apparent anesthesia complications

## 2011-10-28 ENCOUNTER — Encounter (HOSPITAL_COMMUNITY): Payer: Self-pay | Admitting: Dietician

## 2011-10-28 LAB — GLUCOSE, CAPILLARY
Glucose-Capillary: 103 mg/dL — ABNORMAL HIGH (ref 70–99)
Glucose-Capillary: 126 mg/dL — ABNORMAL HIGH (ref 70–99)
Glucose-Capillary: 179 mg/dL — ABNORMAL HIGH (ref 70–99)
Glucose-Capillary: 219 mg/dL — ABNORMAL HIGH (ref 70–99)
Glucose-Capillary: 230 mg/dL — ABNORMAL HIGH (ref 70–99)
Glucose-Capillary: 325 mg/dL — ABNORMAL HIGH (ref 70–99)
Glucose-Capillary: 330 mg/dL — ABNORMAL HIGH (ref 70–99)
Glucose-Capillary: 358 mg/dL — ABNORMAL HIGH (ref 70–99)

## 2011-10-28 MED ORDER — INSULIN PUMP
Freq: Three times a day (TID) | SUBCUTANEOUS | Status: DC
  Administered 2011-10-28: 1 via SUBCUTANEOUS
  Administered 2011-10-28: 11.1 via SUBCUTANEOUS
  Administered 2011-10-29: 5.7 via SUBCUTANEOUS
  Filled 2011-10-28: qty 1

## 2011-10-28 MED ORDER — INSULIN ASPART 100 UNIT/ML ~~LOC~~ SOLN
11.0000 [IU] | Freq: Once | SUBCUTANEOUS | Status: AC
  Administered 2011-10-28: 11 [IU] via SUBCUTANEOUS

## 2011-10-28 NOTE — Progress Notes (Addendum)
Patient self gave self bolus through insulin pump of 7.2 units at 11:24 for blood sugar of 358.  After eating patient bolused with an additional 10.9 units of novolog through insulin pump for lunch (80g of carbs) at 1224. Still waiting on physician to finish in the OR to get official orders.

## 2011-10-28 NOTE — Progress Notes (Signed)
UR COMPLETED  

## 2011-10-28 NOTE — Progress Notes (Signed)
Inpatient Diabetes Program Recommendations  AACE/ADA: New Consensus Statement on Inpatient Glycemic Control (2009)  Target Ranges:  Prepandial:   less than 140 mg/dL      Peak postprandial:   less than 180 mg/dL (1-2 hours)      Critically ill patients:  140 - 180 mg/dL   Reason for Visit: Insulin pump.  Patient has medtronic insulin pump.  She ran out of insulin around 3 am this morning.  Friend brought in her supplies this morning.  She has now reconnected.  Discussed with RN.  Needs Insulin pump order set entered per hospital policy.  Also will need Novolog insulin correction discontinued since insulin pump will provide correction, meal boluses, and basal.   Her basal rates are: 12a-0.875 units/ hour 4a-0.825 8a-0.975 3p-0.925 9p-0.95  Her CHO coverage is 12 a- 1 unit for every 10 grams CHO and 11a-MN- 1 unit for every 8 g CHO.  Her correction factor is 30 mg/dL and goal CBG is 829 mg/dL.  Discussed with RN.  Will be glad to assist as needed.

## 2011-10-28 NOTE — Progress Notes (Signed)
Patient ID: SIMMIE GARIN, female   DOB: 02-23-1956, 56 y.o.   MRN: [id number] Subjective:  The patient is alert and pleasant. She wants to go home tomorrow.  Objective: Vital signs in last 24 hours: Temp:  [97.2 F (36.2 C)-98.1 F (36.7 C)] 97.8 F (36.6 C) (04/11 0940) Pulse Rate:  [87-101] 100  (04/11 0940) Resp:  [12-29] 20  (04/11 0940) BP: (125-196)/(65-85) 130/65 mmHg (04/11 0940) SpO2:  [90 %-99 %] 94 % (04/11 0940)  Intake/Output from previous day: 04/10 0701 - 04/11 0700 In: 2200 [I.V.:2200] Out: 100 [Blood:100] Intake/Output this shift: Total I/O In: 240 [P.O.:240] Out: -   Physical exam the patient is alert and oriented. Her motor strength is normal in all 4 extremities. Her dressing is clean and dry without evidence of hematoma or shift.  Lab Results: No results found for this basename: WBC:2,HGB:2,HCT:2,PLT:2 in the last 72 hours BMET No results found for this basename: NA:2,K:2,CL:2,CO2:2,GLUCOSE:2,BUN:2,CREATININE:2,CALCIUM:2 in the last 72 hours  Studies/Results: Dg Cervical Spine 2-3 Views  10/27/2011  *RADIOLOGY REPORT*  Clinical Data: C5-6 and C6-7 ACDF  CERVICAL SPINE - 2-3 VIEW  Comparison: 08/31/2011  Findings: Initial film shows a needle marking the anterior disc space of C4-5.  Second film shows anterior cervical discectomy and fusion from C5-C7.  Interbody fusion material is in place.  There is a plate anteriorly with screw fixation.  Lower aspect is poorly seen because of shoulder density.  Sponges remain along the operative approach.  IMPRESSION: ACDF C5-C7.  Original Report Authenticated By: Thomasenia Sales, [name]    Assessment/Plan: Postop day 1: The patient is doing well. We will plan to send her home tomorrow.  LOS: 1 day     Esmerelda Finnigan D 10/28/2011, 10:08 AM

## 2011-10-28 NOTE — Consult Note (Signed)
Pt smokes up to 1/2 ppd and says she is not quite ready to quit and will quit eventually on her own. Discussed risk factors and harmful effects of nicotine on her health. Pt verbalizes understanding. Referred to 1-800 quit now for f/u and support. Discussed oral fixation substitutes, second hand smoke and in home smoking policy. Reviewed and gave pt Written education/contact information.

## 2011-10-29 LAB — GLUCOSE, CAPILLARY
Glucose-Capillary: 141 mg/dL — ABNORMAL HIGH (ref 70–99)
Glucose-Capillary: 125 mg/dL — ABNORMAL HIGH (ref 70–99)

## 2011-10-29 MED ORDER — DSS 100 MG PO CAPS: 100.0000 mg | ORAL_CAPSULE | Freq: Two times a day (BID) | ORAL | Status: AC

## 2011-10-29 MED ORDER — TAPENTADOL HCL 50 MG PO TABS: 50.0000 mg | ORAL_TABLET | ORAL | Status: DC | PRN

## 2011-10-29 MED ORDER — DIAZEPAM 5 MG PO TABS: 5.0000 mg | ORAL_TABLET | Freq: Four times a day (QID) | ORAL | Status: DC | PRN

## 2011-10-29 NOTE — Discharge Summary (Signed)
Physician Discharge Summary  Patient ID: Brooke Avery MRN: [id number] DOB/AGE: [birth year] 56 y.o.  Admit date: 10/27/2011 Discharge date: 10/29/2011  Admission Diagnoses: C5-6 and C6-7 disc degeneration, herniated disc, spondylosis, stenosis, cervical radiculopathy/myelopathy, cervicalgia  Discharge Diagnoses: The same Active Problems:  * No active hospital problems. *    Discharged Condition: good  Hospital Course: I admitted the patient to [facility] on 10/27/2011. On the day of admission I performed a C5-6 and C6-7 anterior cervical discectomy, fusion and plating. The surgery were well. The patient's postoperative course was unremarkable and she was requesting discharge to home on 10/29/2011. She was given oral and written discharge instructions all her questions were answered.  Consults: None Significant Diagnostic Studies: None Treatments: C5-6 and C6-7 anterior cervical discectomy fusion and plating. Discharge Exam: Blood pressure 123/72, pulse 86, temperature 98 F (36.7 C), temperature source Oral, resp. rate 20, height 5\' 7"  (1.702 m), weight 70.761 kg (156 lb), SpO2 92.00%. The patient is alert and oriented. She looks well. Her dressing is clean and dry without evidence of hematoma or shift. Her motor strength is normal in all 4 extremities.  Disposition:  Home  Discharge Orders    Future Orders Please Complete By Expires   Diet - low sodium heart healthy      Increase activity slowly      Discharge instructions      Comments:   [phone] for a followup appointment.   Remove dressing in 24 hours      Call MD for:  temperature >100.4      Call MD for:  persistant nausea and vomiting      Call MD for:  severe uncontrolled pain      Call MD for:  redness, tenderness, or signs of infection (pain, swelling, redness, odor or green/yellow discharge around incision site)      Call MD for:  difficulty breathing, headache or visual disturbances      Call MD for:   hives      Call MD for:  persistant dizziness or light-headedness      Call MD for:  extreme fatigue        Medication List  As of 10/29/2011  8:13 AM   STOP taking these medications         TYLOX 5-500 MG per capsule         TAKE these medications         clonazePAM 1 MG tablet   Commonly known as: KLONOPIN   Take 1 mg by mouth 3 (three) times daily as needed. For anxiety      diazepam 5 MG tablet   Commonly known as: VALIUM   Take 1 tablet (5 mg total) by mouth every 6 (six) hours as needed.      DSS 100 MG Caps   Take 100 mg by mouth 2 (two) times daily.      DULoxetine 60 MG capsule   Commonly known as: CYMBALTA   Take 120 mg by mouth daily.      hydrOXYzine 25 MG tablet   Commonly known as: ATARAX/VISTARIL   Take 25-50 mg by mouth every 6 (six) hours as needed. For nausea      insulin aspart 100 UNIT/ML injection   Commonly known as: novoLOG   Inject into the skin continuous.      olmesartan 20 MG tablet   Commonly known as: BENICAR   Take 20 mg by mouth daily.  omeprazole 20 MG tablet   Commonly known as: PRILOSEC OTC   Take 20 mg by mouth daily.      pravastatin 80 MG tablet   Commonly known as: PRAVACHOL   Take 80 mg by mouth daily.      tapentadol 50 MG Tabs   Commonly known as: NUCYNTA   Take 1 tablet (50 mg total) by mouth every 4 (four) hours as needed.             SignedCristi Loron 10/29/2011, 8:13 AM

## 2011-11-01 ENCOUNTER — Encounter (HOSPITAL_COMMUNITY): Payer: Self-pay | Admitting: Emergency Medicine

## 2011-11-01 ENCOUNTER — Inpatient Hospital Stay (HOSPITAL_COMMUNITY)
Admission: EM | Admit: 2011-11-01 | Discharge: 2011-11-04 | DRG: 312 | Disposition: A | Discharge status: Skilled Nursing Facility | Payer: Medicare Other | Attending: Internal Medicine | Admitting: Internal Medicine

## 2011-11-01 ENCOUNTER — Emergency Department (HOSPITAL_COMMUNITY): Payer: Medicare Other

## 2011-11-01 DIAGNOSIS — M545 Low back pain, unspecified: Secondary | ICD-10-CM | POA: Diagnosis not present

## 2011-11-01 DIAGNOSIS — Z79899 Other long term (current) drug therapy: Secondary | ICD-10-CM | POA: Diagnosis not present

## 2011-11-01 DIAGNOSIS — E109 Type 1 diabetes mellitus without complications: Secondary | ICD-10-CM | POA: Diagnosis not present

## 2011-11-01 DIAGNOSIS — M810 Age-related osteoporosis without current pathological fracture: Secondary | ICD-10-CM | POA: Diagnosis not present

## 2011-11-01 DIAGNOSIS — D72829 Elevated white blood cell count, unspecified: Secondary | ICD-10-CM | POA: Diagnosis not present

## 2011-11-01 DIAGNOSIS — F3130 Bipolar disorder, current episode depressed, mild or moderate severity, unspecified: Secondary | ICD-10-CM | POA: Diagnosis not present

## 2011-11-01 DIAGNOSIS — F17200 Nicotine dependence, unspecified, uncomplicated: Secondary | ICD-10-CM | POA: Diagnosis present

## 2011-11-01 DIAGNOSIS — IMO0001 Reserved for inherently not codable concepts without codable children: Secondary | ICD-10-CM | POA: Diagnosis present

## 2011-11-01 DIAGNOSIS — H9190 Unspecified hearing loss, unspecified ear: Secondary | ICD-10-CM | POA: Diagnosis not present

## 2011-11-01 DIAGNOSIS — G4730 Sleep apnea, unspecified: Secondary | ICD-10-CM | POA: Diagnosis present

## 2011-11-01 DIAGNOSIS — I10 Essential (primary) hypertension: Secondary | ICD-10-CM | POA: Diagnosis present

## 2011-11-01 DIAGNOSIS — E1049 Type 1 diabetes mellitus with other diabetic neurological complication: Secondary | ICD-10-CM | POA: Diagnosis not present

## 2011-11-01 DIAGNOSIS — R55 Syncope and collapse: Principal | ICD-10-CM | POA: Diagnosis present

## 2011-11-01 DIAGNOSIS — K219 Gastro-esophageal reflux disease without esophagitis: Secondary | ICD-10-CM | POA: Diagnosis present

## 2011-11-01 DIAGNOSIS — M4802 Spinal stenosis, cervical region: Secondary | ICD-10-CM

## 2011-11-01 DIAGNOSIS — Z794 Long term (current) use of insulin: Secondary | ICD-10-CM

## 2011-11-01 DIAGNOSIS — Z9641 Presence of insulin pump (external) (internal): Secondary | ICD-10-CM

## 2011-11-01 DIAGNOSIS — E1142 Type 2 diabetes mellitus with diabetic polyneuropathy: Secondary | ICD-10-CM | POA: Diagnosis present

## 2011-11-01 DIAGNOSIS — E785 Hyperlipidemia, unspecified: Secondary | ICD-10-CM | POA: Diagnosis present

## 2011-11-01 DIAGNOSIS — Z981 Arthrodesis status: Secondary | ICD-10-CM

## 2011-11-01 DIAGNOSIS — F411 Generalized anxiety disorder: Secondary | ICD-10-CM | POA: Diagnosis not present

## 2011-11-01 DIAGNOSIS — M25569 Pain in unspecified knee: Secondary | ICD-10-CM | POA: Diagnosis not present

## 2011-11-01 DIAGNOSIS — F319 Bipolar disorder, unspecified: Secondary | ICD-10-CM | POA: Insufficient documentation

## 2011-11-01 DIAGNOSIS — M797 Fibromyalgia: Secondary | ICD-10-CM | POA: Diagnosis present

## 2011-11-01 DIAGNOSIS — I517 Cardiomegaly: Secondary | ICD-10-CM | POA: Diagnosis not present

## 2011-11-01 DIAGNOSIS — E1065 Type 1 diabetes mellitus with hyperglycemia: Secondary | ICD-10-CM | POA: Diagnosis not present

## 2011-11-01 DIAGNOSIS — T148XXA Other injury of unspecified body region, initial encounter: Secondary | ICD-10-CM | POA: Diagnosis not present

## 2011-11-01 HISTORY — DX: Spinal stenosis, cervical region: M48.02

## 2011-11-01 LAB — HEPATIC FUNCTION PANEL
ALT: 6 U/L (ref 0–35)
AST: 17 U/L (ref 0–37)
Albumin: 3.4 g/dL — ABNORMAL LOW (ref 3.5–5.2)
Alkaline Phosphatase: 93 U/L (ref 39–117)
Bilirubin, Direct: 0.1 mg/dL (ref 0.0–0.3)
Indirect Bilirubin: 0.4 mg/dL (ref 0.3–0.9)
Total Bilirubin: 0.5 mg/dL (ref 0.3–1.2)
Total Protein: 6.4 g/dL (ref 6.0–8.3)

## 2011-11-01 LAB — CBC
HCT: 40.2 % (ref 36.0–46.0)
Hemoglobin: 13.8 g/dL (ref 12.0–15.0)
MCH: 30 pg (ref 26.0–34.0)
MCHC: 34.3 g/dL (ref 30.0–36.0)
MCV: 87.4 fL (ref 78.0–100.0)
Platelets: 297 10*3/uL (ref 150–400)
RBC: 4.6 MIL/uL (ref 3.87–5.11)
RDW: 12.3 % (ref 11.5–15.5)
WBC: 14.3 10*3/uL — ABNORMAL HIGH (ref 4.0–10.5)

## 2011-11-01 LAB — BASIC METABOLIC PANEL
BUN: 17 mg/dL (ref 6–23)
CO2: 25 mEq/L (ref 19–32)
Calcium: 9.5 mg/dL (ref 8.4–10.5)
Chloride: 97 mEq/L (ref 96–112)
Creatinine, Ser: 0.89 mg/dL (ref 0.50–1.10)
GFR calc Af Amer: 82 mL/min — ABNORMAL LOW (ref >90)
GFR calc non Af Amer: 71 mL/min — ABNORMAL LOW (ref >90)
Glucose, Bld: 224 mg/dL — ABNORMAL HIGH (ref 70–99)
Potassium: 4.4 mEq/L (ref 3.5–5.1)
Sodium: 135 mEq/L (ref 135–145)

## 2011-11-01 LAB — URINALYSIS, ROUTINE W REFLEX MICROSCOPIC
Bilirubin Urine: NEGATIVE
Glucose, UA: 500 mg/dL — AB
Hgb urine dipstick: NEGATIVE
Ketones, ur: NEGATIVE mg/dL
Nitrite: NEGATIVE
Protein, ur: NEGATIVE mg/dL
Specific Gravity, Urine: 1.011 (ref 1.005–1.030)
Urobilinogen, UA: 1 mg/dL (ref 0.0–1.0)
pH: 6.5 (ref 5.0–8.0)

## 2011-11-01 LAB — CARDIAC PANEL(CRET KIN+CKTOT+MB+TROPI)
CK, MB: 1.7 ng/mL (ref 0.3–4.0)
CK, MB: 1.6 ng/mL (ref 0.3–4.0)
CK, MB: 1.7 ng/mL (ref 0.3–4.0)
Relative Index: INVALID (ref 0.0–2.5)
Relative Index: INVALID (ref 0.0–2.5)
Relative Index: INVALID (ref 0.0–2.5)
Total CK: 56 U/L (ref 7–177)
Total CK: 54 U/L (ref 7–177)
Total CK: 44 U/L (ref 7–177)
Troponin I: <0.3 ng/mL (ref <0.30)
Troponin I: <0.3 ng/mL (ref <0.30)
Troponin I: <0.3 ng/mL (ref <0.30)

## 2011-11-01 LAB — HEMOGLOBIN A1C
Hgb A1c MFr Bld: 8.2 % — ABNORMAL HIGH (ref <5.7)
Mean Plasma Glucose: 189 mg/dL — ABNORMAL HIGH (ref <117)

## 2011-11-01 LAB — URINE MICROSCOPIC-ADD ON

## 2011-11-01 LAB — DIFFERENTIAL
Basophils Absolute: 0 10*3/uL (ref 0.0–0.1)
Basophils Relative: 0 % (ref 0–1)
Eosinophils Absolute: 0.1 10*3/uL (ref 0.0–0.7)
Eosinophils Relative: 1 % (ref 0–5)
Lymphocytes Relative: 17 % (ref 12–46)
Lymphs Abs: 2.4 10*3/uL (ref 0.7–4.0)
Monocytes Absolute: 1.1 10*3/uL — ABNORMAL HIGH (ref 0.1–1.0)
Monocytes Relative: 8 % (ref 3–12)
Neutro Abs: 10.7 10*3/uL — ABNORMAL HIGH (ref 1.7–7.7)
Neutrophils Relative %: 74 % (ref 43–77)

## 2011-11-01 LAB — GLUCOSE, CAPILLARY
Glucose-Capillary: 203 mg/dL — ABNORMAL HIGH (ref 70–99)
Glucose-Capillary: 154 mg/dL — ABNORMAL HIGH (ref 70–99)
Glucose-Capillary: 326 mg/dL — ABNORMAL HIGH (ref 70–99)
Glucose-Capillary: 286 mg/dL — ABNORMAL HIGH (ref 70–99)
Glucose-Capillary: 229 mg/dL — ABNORMAL HIGH (ref 70–99)

## 2011-11-01 MED ORDER — ONDANSETRON HCL 4 MG PO TABS
4.0000 mg | ORAL_TABLET | Freq: Four times a day (QID) | ORAL | Status: DC | PRN
  Administered 2011-11-03: 4 mg via ORAL
  Filled 2011-11-01: qty 1

## 2011-11-01 MED ORDER — MORPHINE SULFATE 4 MG/ML IJ SOLN
4.0000 mg | Freq: Once | INTRAMUSCULAR | Status: AC
  Administered 2011-11-01: 4 mg via INTRAVENOUS
  Filled 2011-11-01 (×2): qty 1

## 2011-11-01 MED ORDER — SIMVASTATIN 5 MG PO TABS
5.0000 mg | ORAL_TABLET | Freq: Every day | ORAL | Status: DC
  Administered 2011-11-01 – 2011-11-03 (×3): 5 mg via ORAL
  Filled 2011-11-01 (×4): qty 1

## 2011-11-01 MED ORDER — ENOXAPARIN SODIUM 40 MG/0.4ML ~~LOC~~ SOLN
40.0000 mg | SUBCUTANEOUS | Status: DC
  Administered 2011-11-01 – 2011-11-04 (×4): 40 mg via SUBCUTANEOUS
  Filled 2011-11-01 (×5): qty 0.4

## 2011-11-01 MED ORDER — ONDANSETRON HCL 4 MG/2ML IJ SOLN
4.0000 mg | Freq: Once | INTRAMUSCULAR | Status: AC
  Administered 2011-11-01: 4 mg via INTRAVENOUS
  Filled 2011-11-01: qty 2

## 2011-11-01 MED ORDER — ONDANSETRON HCL 4 MG/2ML IJ SOLN
4.0000 mg | Freq: Four times a day (QID) | INTRAMUSCULAR | Status: DC | PRN
  Administered 2011-11-01 – 2011-11-02 (×4): 4 mg via INTRAVENOUS
  Filled 2011-11-01 (×4): qty 2

## 2011-11-01 MED ORDER — MORPHINE SULFATE 4 MG/ML IJ SOLN
4.0000 mg | INTRAMUSCULAR | Status: DC | PRN
  Administered 2011-11-01 – 2011-11-02 (×5): 4 mg via INTRAVENOUS
  Filled 2011-11-01 (×6): qty 1

## 2011-11-01 MED ORDER — INSULIN ASPART 100 UNIT/ML ~~LOC~~ SOLN
0.0000 [IU] | Freq: Three times a day (TID) | SUBCUTANEOUS | Status: DC
  Administered 2011-11-01: 3 [IU] via SUBCUTANEOUS
  Administered 2011-11-01: 2 [IU] via SUBCUTANEOUS
  Administered 2011-11-01 – 2011-11-02 (×2): 7 [IU] via SUBCUTANEOUS
  Administered 2011-11-02 – 2011-11-03 (×3): 5 [IU] via SUBCUTANEOUS
  Administered 2011-11-03 (×2): 2 [IU] via SUBCUTANEOUS
  Administered 2011-11-04: 3 [IU] via SUBCUTANEOUS
  Administered 2011-11-04: 5 [IU] via SUBCUTANEOUS
  Filled 2011-11-01 (×3): qty 1

## 2011-11-01 MED ORDER — DOCUSATE SODIUM 100 MG PO CAPS
100.0000 mg | ORAL_CAPSULE | Freq: Two times a day (BID) | ORAL | Status: DC
  Administered 2011-11-01 – 2011-11-04 (×7): 100 mg via ORAL
  Filled 2011-11-01 (×8): qty 1

## 2011-11-01 MED ORDER — OMEPRAZOLE MAGNESIUM 20 MG PO TBEC
20.0000 mg | DELAYED_RELEASE_TABLET | Freq: Every day | ORAL | Status: DC
  Administered 2011-11-01: 20 mg via ORAL
  Filled 2011-11-01 (×2): qty 1

## 2011-11-01 MED ORDER — DULOXETINE HCL 60 MG PO CPEP
120.0000 mg | ORAL_CAPSULE | Freq: Every day | ORAL | Status: DC
  Administered 2011-11-01 – 2011-11-04 (×4): 120 mg via ORAL
  Filled 2011-11-01 (×4): qty 2

## 2011-11-01 MED ORDER — SODIUM CHLORIDE 0.9 % IV SOLN
INTRAVENOUS | Status: DC
  Administered 2011-11-01 – 2011-11-02 (×3): via INTRAVENOUS

## 2011-11-01 MED ORDER — CLONAZEPAM 1 MG PO TABS
1.0000 mg | ORAL_TABLET | Freq: Three times a day (TID) | ORAL | Status: DC | PRN
  Administered 2011-11-01 – 2011-11-04 (×6): 1 mg via ORAL
  Filled 2011-11-01: qty 2
  Filled 2011-11-01 (×5): qty 1

## 2011-11-01 MED ORDER — SODIUM CHLORIDE 0.9 % IJ SOLN
3.0000 mL | Freq: Two times a day (BID) | INTRAMUSCULAR | Status: DC
  Administered 2011-11-01 – 2011-11-03 (×3): 3 mL via INTRAVENOUS
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
## 2011-11-01 NOTE — ED Notes (Signed)
Internal med res into see pt

## 2011-11-01 NOTE — ED Notes (Signed)
CBG checked by EMT R Tanayia [id number]

## 2011-11-01 NOTE — ED Notes (Signed)
Echo lab at bedside to perform 2d echo

## 2011-11-01 NOTE — ED Notes (Addendum)
Awaiting admission. Pt has had several syncopal episodes over the past day with assoc falls. Pt had neck surgery last week. Pt remains alert and oriented with cms intact. C/o pain all over. Pt states feeling anxious and is crying. Discomfort to be addressed with medications. Diet tray ordered.
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
## 2011-11-01 NOTE — ED Notes (Signed)
Requesting additional analgesia

## 2011-11-01 NOTE — ED Notes (Signed)
Patient is resting comfortably. Carb modified diet ordered for pt.

## 2011-11-01 NOTE — ED Notes (Addendum)
Called and gave report to Deborah. 

## 2011-11-01 NOTE — ED Notes (Signed)
12 syncope episode and fell each time, c collar from recent cervical neck surgery. Sometime she fell forward, sometimes she fell back in her chair.

## 2011-11-01 NOTE — Progress Notes (Signed)
  Echocardiogram 2D Echocardiogram has been performed.  Brooke Avery 11/01/2011, 3:05 PM 

## 2011-11-01 NOTE — ED Notes (Signed)
Pt aware of need to supply urine sample.

## 2011-11-01 NOTE — ED Notes (Signed)
Patient transported to CT 

## 2011-11-01 NOTE — ED Notes (Signed)
Pt has Aspen neck collar on from recent surgery

## 2011-11-01 NOTE — ED Notes (Addendum)
Took anti dizziness pill, anti depressant med, did not eat anything, just a milk shake. went to bed friday night and wake around 2pm on sat, took more pain med and some nutrient bar and went back to bed and wake up Sunday 2:30pm. stated blood sugar was low, and had more milk shake. Around 4:30 starting falling anytime she tries to get up. Stated 12 syncope episode with each resulting to a fall.  Pt has right lower abdomen insulin pump in place.

## 2011-11-02 ENCOUNTER — Inpatient Hospital Stay (HOSPITAL_COMMUNITY): Payer: Medicare Other

## 2011-11-02 DIAGNOSIS — R55 Syncope and collapse: Principal | ICD-10-CM

## 2011-11-02 DIAGNOSIS — Z981 Arthrodesis status: Secondary | ICD-10-CM | POA: Diagnosis not present

## 2011-11-02 LAB — DIFFERENTIAL
Basophils Absolute: 0.1 10*3/uL (ref 0.0–0.1)
Basophils Relative: 0 % (ref 0–1)
Eosinophils Absolute: 0.5 10*3/uL (ref 0.0–0.7)
Eosinophils Relative: 4 % (ref 0–5)
Lymphocytes Relative: 14 % (ref 12–46)
Lymphs Abs: 1.8 10*3/uL (ref 0.7–4.0)
Monocytes Absolute: 0.9 10*3/uL (ref 0.1–1.0)
Monocytes Relative: 7 % (ref 3–12)
Neutro Abs: 10.1 10*3/uL — ABNORMAL HIGH (ref 1.7–7.7)
Neutrophils Relative %: 76 % (ref 43–77)

## 2011-11-02 LAB — CBC
HCT: 37 % (ref 36.0–46.0)
Hemoglobin: 12.3 g/dL (ref 12.0–15.0)
MCH: 30 pg (ref 26.0–34.0)
MCHC: 33.2 g/dL (ref 30.0–36.0)
MCV: 90.2 fL (ref 78.0–100.0)
Platelets: 310 10*3/uL (ref 150–400)
RBC: 4.1 MIL/uL (ref 3.87–5.11)
RDW: 12.2 % (ref 11.5–15.5)
WBC: 13.3 10*3/uL — ABNORMAL HIGH (ref 4.0–10.5)

## 2011-11-02 LAB — GLUCOSE, CAPILLARY
Glucose-Capillary: 334 mg/dL — ABNORMAL HIGH (ref 70–99)
Glucose-Capillary: 251 mg/dL — ABNORMAL HIGH (ref 70–99)
Glucose-Capillary: 259 mg/dL — ABNORMAL HIGH (ref 70–99)
Glucose-Capillary: 249 mg/dL — ABNORMAL HIGH (ref 70–99)

## 2011-11-02 LAB — CARDIAC PANEL(CRET KIN+CKTOT+MB+TROPI)
CK, MB: 1.7 ng/mL (ref 0.3–4.0)
Relative Index: INVALID (ref 0.0–2.5)
Total CK: 43 U/L (ref 7–177)
Troponin I: <0.3 ng/mL (ref <0.30)

## 2011-11-02 LAB — BASIC METABOLIC PANEL
BUN: 13 mg/dL (ref 6–23)
CO2: 21 mEq/L (ref 19–32)
Calcium: 9 mg/dL (ref 8.4–10.5)
Chloride: 99 mEq/L (ref 96–112)
Creatinine, Ser: 0.64 mg/dL (ref 0.50–1.10)
GFR calc Af Amer: >90 mL/min (ref >90)
GFR calc non Af Amer: >90 mL/min (ref >90)
Glucose, Bld: 366 mg/dL — ABNORMAL HIGH (ref 70–99)
Potassium: 5.3 mEq/L — ABNORMAL HIGH (ref 3.5–5.1)
Sodium: 136 mEq/L (ref 135–145)

## 2011-11-02 LAB — URINE CULTURE
Colony Count: NO GROWTH
Culture  Setup Time: 201304151425
Culture: NO GROWTH
Special Requests: NORMAL

## 2011-11-02 MED ORDER — INSULIN GLARGINE 100 UNIT/ML ~~LOC~~ SOLN
22.0000 [IU] | Freq: Every day | SUBCUTANEOUS | Status: DC
  Administered 2011-11-02 – 2011-11-03 (×2): 22 [IU] via SUBCUTANEOUS

## 2011-11-02 MED ORDER — OXYCODONE-ACETAMINOPHEN 5-325 MG PO TABS
1.0000 | ORAL_TABLET | ORAL | Status: DC | PRN
  Administered 2011-11-02 – 2011-11-04 (×8): 1 via ORAL
  Filled 2011-11-02 (×9): qty 1

## 2011-11-02 MED ORDER — OMEPRAZOLE 20 MG PO CPDR
20.0000 mg | DELAYED_RELEASE_CAPSULE | Freq: Every day | ORAL | Status: DC
  Administered 2011-11-02 – 2011-11-04 (×3): 20 mg via ORAL
  Filled 2011-11-02 (×3): qty 1

## 2011-11-02 MED ORDER — INSULIN ASPART 100 UNIT/ML ~~LOC~~ SOLN
4.0000 [IU] | Freq: Three times a day (TID) | SUBCUTANEOUS | Status: DC
  Administered 2011-11-02 – 2011-11-03 (×3): 4 [IU] via SUBCUTANEOUS

## 2011-11-02 NOTE — Progress Notes (Signed)
Spoke with Dr.Jenkins; new orders received; was told someone would stop by and see pt tomorrow

## 2011-11-02 NOTE — Progress Notes (Signed)
OT Cancellation Note  Treatment cancelled today due to patient's refusal to participate. Pt just had pain meds. Will recheck on pt. Brooke Avery 11/02/2011, 10:08 AM

## 2011-11-02 NOTE — Progress Notes (Signed)
Inpatient Diabetes Program Recommendations  AACE/ADA: New Consensus Statement on Inpatient Glycemic Control (2009)  Target Ranges:  Prepandial:   less than 140 mg/dL      Peak postprandial:   less than 180 mg/dL (1-2 hours)      Critically ill patients:  140 - 180 mg/dL   Reason for Visit:  Hyperglycemia in type 1 patient without basal insulin   Inpatient Diabetes Program Recommendations Insulin - Basal: No basal insulin ordered.  Patient has type 1 diabetes.  Recommend Lantus 22 units daily starting immediately. Correction (SSI): Recommend continuing correction Insulin - Meal Coverage: Usually takes meal bolus via pump.  Recommend 4 units tid with meals.  Note: Unaware that patient uses insulin pump until I spoke with her. Pump taken off some time during night with no alternate form of basal insulin delivery.  (Difficult to communicate with her-- recent morphine plus did not realize patient not wearing hearing aide.  Patient states too tired to manage pump at present.)  Insulin pump settings were as follows:  12 mn = 0.875; 4A =0.825; 8A= 0.975; 3P= 0.925; 9P = 0.950 for total of 22.025.  Insulin to CHO ratios 12 MN to 11AM is 1:10.  From 11AM to 12 MN is 1:8.  Sensitivity factor = 30.  Glucose target = 140 mg/dl.  Discussed above  Recommendations for Lantus and meal coverage with Dr. Larey Seat.  Keoshia Steinmetz S. Elsie Lincoln, RN, CNS, CDE  [phone])

## 2011-11-02 NOTE — Progress Notes (Signed)
Subjective:  Continues to have a lot of diffuse pain this morning.  No CP or trouble breathing.  Reports that she did hit her ear on at least one of the multiple falls.  She reports that she took "2-4 Klonopin, one cymbalta, and one percocet" preceding the falls.    Objective: Vital signs in last 24 hours: Filed Vitals:   11/01/11 2242 11/01/11 2244 11/01/11 2248 11/02/11 0500  BP: 119/57 116/61 118/63 126/72  Pulse: 90 94 97 96  Temp:    98.3 F (36.8 C)  TempSrc:    Axillary  Resp:    18  Height:      Weight:      SpO2:    98%   Physical Exam:  General:  Vital signs reviewed and noted. Well-developed, well-nourished, in mild acute distress 2/2 pain; alert, appropriate and cooperative throughout examination.   Head:  Normocephalic, atraumatic.   Eyes:  PERRL, EOMI   Neck:  Aspen neck collar in place.  Bandage in place anteriorly over neck under collar.  Lungs:  Normal respiratory effort. Clear to auscultation BL without crackles or wheezes.   Heart:  RRR. S1 and S2 normal without gallop, murmur, or rubs.   Extremities:  No pretibial edema.   Neurologic:  A&O X3, CN II - XII are grossly intact. Motor strength is 5/5 in the all 4 extremities though slightly decreased but seems like it is effort dependent which is poor because of pain, Sensations intact to light touch.  R 5th toe has small abrasion and surrounding dried blood.  Associated hematoma over top of L foot.     Lab Results: Basic Metabolic Panel:  Lab 11/02/11 9604 11/01/11 0559  NA 136 135  K 5.3* 4.4  CL 99 97  CO2 21 25  GLUCOSE 366* 224*  BUN 13 17  CREATININE 0.64 0.89  CALCIUM 9.0 9.5  MG -- --  PHOS -- --   Liver Function Tests:  Lab 11/01/11 0909  AST 17  ALT 6  ALKPHOS 93  BILITOT 0.5  PROT 6.4  ALBUMIN 3.4*   CBC:  Lab 11/02/11 0716 11/01/11 0559  WBC 13.3* 14.3*  NEUTROABS 10.1* 10.7*  HGB 12.3 13.8  HCT 37.0 40.2  MCV 90.2 87.4  PLT 310 297   Cardiac Enzymes:  Lab  11/02/11 0054 11/01/11 1551 11/01/11 0910  CKTOTAL 43 44 54  CKMB 1.7 1.7 1.6  CKMBINDEX -- -- --  TROPONINI <0.30 <0.30 <0.30   CBG:  Lab 11/02/11 0720 11/01/11 2219 11/01/11 2020 11/01/11 1724 11/01/11 1210 11/01/11 0922  GLUCAP 334* 229* 286* 326* 154* 203*   Hemoglobin A1C:  Lab 11/01/11 0909  HGBA1C 8.2*   Urinalysis:  Lab 11/01/11 1335  COLORURINE YELLOW  LABSPEC 1.011  PHURINE 6.5  GLUCOSEU 500*  HGBUR NEGATIVE  BILIRUBINUR NEGATIVE  KETONESUR NEGATIVE  PROTEINUR NEGATIVE  UROBILINOGEN 1.0  NITRITE NEGATIVE  LEUKOCYTESUR SMALL*    Micro Results: Recent Results (from the past 240 hour(s))  URINE CULTURE     Status: Normal   Collection Time   11/01/11  1:35 PM      Component Value Range Status Comment   Specimen Description URINE, RANDOM   Final    Special Requests Normal   Final    Culture  Setup Time [id number]   Final    Colony Count NO GROWTH   Final    Culture NO GROWTH   Final    Report Status 11/02/2011 FINAL   Final  Studies/Results: Dg Chest 2 View  11/01/2011  *RADIOLOGY REPORT*  Clinical Data: Syncope  CHEST - 2 VIEW  Comparison: 10/19/2011  Findings: Lungs are essentially clear. No pleural effusion or pneumothorax.  Cardiomediastinal silhouette is within normal limits.  Visualized osseous structures are within normal limits.  Cervical spine fixation hardware.  IMPRESSION: No evidence of acute cardiopulmonary disease.  Original Report Authenticated By: Charline Bills, [name]   Ct Head Wo Contrast  11/01/2011  *RADIOLOGY REPORT*  Clinical Data: Status post multiple syncopal episodes and falls.  CT HEAD WITHOUT CONTRAST  Technique:  Contiguous axial images were obtained from the base of the skull through the vertex without contrast.  Comparison: CT of the head performed 10/31/2009  Findings: There is no evidence of acute infarction, mass lesion, or intra- or extra-axial hemorrhage on CT.  Prominence of the sulci suggests mild cortical volume loss.  Scattered periventricular white matter change likely reflects small vessel ischemic microangiopathy.  The posterior fossa, including the cerebellum, brainstem and fourth ventricle, is within normal limits.  The third and lateral ventricles, and basal ganglia are unremarkable in appearance.  The cerebral hemispheres demonstrate grossly normal gray-white differentiation.  No mass effect or midline shift is seen.  There is no evidence of fracture; visualized osseous structures are unremarkable in appearance.  The visualized portions of the orbits are within normal limits.  The paranasal sinuses and mastoid air cells are well-aerated.  No significant soft tissue abnormalities are seen.  IMPRESSION:  1.  No evidence of traumatic intracranial injury or fracture. 2.  Mild cortical volume loss and scattered small vessel ischemic microangiopathy.  Original Report Authenticated By: Tonia Ghent, [name]   Dg Knee Complete 4 Views Left  11/01/2011  *RADIOLOGY REPORT*  Clinical Data: Fall, knee pain  LEFT KNEE - COMPLETE 4+ VIEW  Comparison: None.  Findings: No fracture or dislocation is seen.  Joint spaces are essentially preserved.  Very mild patellofemoral spurring.  The visualized soft tissues are unremarkable.  No suprapatellar knee joint effusion.  IMPRESSION: No fracture or dislocation is seen.  Original Report Authenticated By: Charline Bills, [name]   Dg Knee Complete 4 Views Right  11/01/2011  *RADIOLOGY REPORT*  Clinical Data: Fall, knee pain  RIGHT KNEE - COMPLETE 4+ VIEW  Comparison: None.  Findings: No fracture or dislocation is seen.  The joint spaces are essentially preserved.  The visualized soft tissues are unremarkable.  No suprapatellar knee joint effusion.  IMPRESSION: No fracture or dislocation is seen.  Original Report Authenticated By: Charline Bills, [name]   Medications: I have reviewed the patient's current medications. Scheduled Meds:   . docusate sodium  100 mg Oral BID  . DULoxetine  120  mg Oral Daily  . enoxaparin  40 mg Subcutaneous Q24H  . insulin aspart  0-9 Units Subcutaneous TID WC  . omeprazole  20 mg Oral Q1200  . simvastatin  5 mg Oral q1800  . sodium chloride  3 mL Intravenous Q12H  . DISCONTD: omeprazole  20 mg Oral Daily   Continuous Infusions:   . sodium chloride 150 mL/hr at 11/02/11 0256   PRN Meds:.clonazePAM, morphine injection, ondansetron (ZOFRAN) IV, ondansetron   Assessment/Plan:  This is a 56 year old woman with history of recent cervical spine surgery who presents for multiple un witnessed syncopal episodes.   Syncope - Questionable syncopal episodes as described by patient. Differential includes medication related as patient was taking anti-anxiety and pain medication on a scheduled basis with minimal oral intake vs autonomic  dysfunction given history of DM, orthostatic hypotension as patient was still taking anti-hypertensives despite poor oral intake vs arrhythmia vs vasovagal. Of note, CT head within normal limits. Orthostatic vitals done last night are suspicious for error recording (57 pulse supine same as diastolic BP and out of line with other pulses here), not orthostatic this morning after IVF.  Unsure if she was orthostatic on admission.  IVFs discontinued.  Echo showed normal EF, left atrium upper limits of normal size.  Seems that medications are most likely cause of her episodes at home ("took between 2-4 Klonopins, a Percocet, and Cymbalta" all at once).    Plan:  -continue telemetry  -Spoke with Spine surgeon Dr. Lovell Sheehan who did her surgery.  He ordered C spine XRay and will see patient tomorrow. -PT/OT  -Will transition of IV narcotics to PO Percocet that she says she takes at home -SW or case management consult tomorrow to arrange for home health RN and PT/OT   History of Recent Surgical Spine Surgical:  -Can shower whenever she wants, keep on collar   DM Type 1 - Lantus and meal time novolog plus sliding scale while here since  she is not cognating well enough to use her insulin pump.  HTN - Continue to hold Benicar  Fibromyalgia - Percocet  Leukocytosis - WBC 14.3 with elevated ANC. Patient denies cough, flu-like symptoms, and urinary symptoms. No CXR findings suggesting new infiltrates. This may be reactive, although was normal when she was discharged. No tenderness at site of c-spine surgery. Dr. Lovell Sheehan ordered c spine films and will see pt tomorrow.        LOS: 1 day   Blanca Friend 11/02/2011, 11:19 AM
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
## 2011-11-02 NOTE — Progress Notes (Signed)
OT spoke with Dr. Lovell Sheehan office and notified them of pts admission as they were not aware. OT ask for clarification of collar and if pt could shower.  OT spoke with Kriste Basque, Charity fundraiser. Pt will need SNF as she does not have assist at home. Lise Auer, OT
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
## 2011-11-03 DIAGNOSIS — R55 Syncope and collapse: Secondary | ICD-10-CM | POA: Diagnosis not present

## 2011-11-03 LAB — GLUCOSE, CAPILLARY
Glucose-Capillary: 283 mg/dL — ABNORMAL HIGH (ref 70–99)
Glucose-Capillary: 188 mg/dL — ABNORMAL HIGH (ref 70–99)
Glucose-Capillary: 172 mg/dL — ABNORMAL HIGH (ref 70–99)
Glucose-Capillary: 229 mg/dL — ABNORMAL HIGH (ref 70–99)

## 2011-11-03 LAB — BASIC METABOLIC PANEL
BUN: 8 mg/dL (ref 6–23)
CO2: 26 mEq/L (ref 19–32)
Calcium: 9 mg/dL (ref 8.4–10.5)
Chloride: 96 mEq/L (ref 96–112)
Creatinine, Ser: 0.55 mg/dL (ref 0.50–1.10)
GFR calc Af Amer: >90 mL/min (ref >90)
GFR calc non Af Amer: >90 mL/min (ref >90)
Glucose, Bld: 291 mg/dL — ABNORMAL HIGH (ref 70–99)
Potassium: 4.3 mEq/L (ref 3.5–5.1)
Sodium: 133 mEq/L — ABNORMAL LOW (ref 135–145)

## 2011-11-03 LAB — CBC
HCT: 33.1 % — ABNORMAL LOW (ref 36.0–46.0)
Hemoglobin: 11.2 g/dL — ABNORMAL LOW (ref 12.0–15.0)
MCH: 29.2 pg (ref 26.0–34.0)
MCHC: 33.8 g/dL (ref 30.0–36.0)
MCV: 86.4 fL (ref 78.0–100.0)
Platelets: 271 10*3/uL (ref 150–400)
RBC: 3.83 MIL/uL — ABNORMAL LOW (ref 3.87–5.11)
RDW: 11.8 % (ref 11.5–15.5)
WBC: 9.8 10*3/uL (ref 4.0–10.5)

## 2011-11-03 MED ORDER — OXYCODONE HCL 5 MG PO TABS
5.0000 mg | ORAL_TABLET | Freq: Once | ORAL | Status: AC
  Administered 2011-11-03: 5 mg via ORAL
  Filled 2011-11-03: qty 1

## 2011-11-03 MED ORDER — INSULIN ASPART 100 UNIT/ML ~~LOC~~ SOLN
5.0000 [IU] | Freq: Three times a day (TID) | SUBCUTANEOUS | Status: DC
  Administered 2011-11-03 – 2011-11-04 (×3): 5 [IU] via SUBCUTANEOUS

## 2011-11-03 MED ORDER — INSULIN GLARGINE 100 UNIT/ML ~~LOC~~ SOLN
24.0000 [IU] | Freq: Every day | SUBCUTANEOUS | Status: DC
  Administered 2011-11-04: 24 [IU] via SUBCUTANEOUS

## 2011-11-03 MED ORDER — IBUPROFEN 600 MG PO TABS
600.0000 mg | ORAL_TABLET | Freq: Four times a day (QID) | ORAL | Status: DC | PRN
  Administered 2011-11-03 – 2011-11-04 (×3): 600 mg via ORAL
  Filled 2011-11-03 (×3): qty 1

## 2011-11-03 NOTE — Progress Notes (Signed)
Subjective: Patient reports that percocet and additional Oxy IR dose overnight finally helped her have some less pain and she managed to get a little bit of sleep.  She is feeling happier this morning and eating her breakfast.  She reports that her pain extends from the back of her neck down over the top of each shoulder and down the arms.  Some lower back pain that she says is from spasms as well.  She has some numbness in her hands that has improved since before surgery.  Numbness/tingling in her feet is at her baseline from before surgery.    She wishes to go to a nursing facility after she leaves the hospital because she is scared to live by herself again right now given what happened.  She is also looking forward to making new friends at a nursing facility.    Objective: Vital signs in last 24 hours: Filed Vitals:   11/02/11 1400 11/02/11 2000 11/03/11 0000 11/03/11 0400  BP: 119/49 112/69 117/71 144/93  Pulse: 95 82 89 70  Temp: 98.3 F (36.8 C) 98.7 F (37.1 C) 99.1 F (37.3 C) 98.7 F (37.1 C)  TempSrc: Oral Oral Oral   Resp: 18 16 18 16   Height:      Weight:      SpO2: 100% 90% 90% 96%   Physical Exam:  General:  Vital signs reviewed and noted. Well-developed, well-nourished, in NAD; alert, appropriate and cooperative throughout examination.   Head:  Normocephalic, atraumatic  Eyes:  PERRL, EOMI   Neck:  Aspen neck collar in place.  Lungs:  Normal respiratory effort. Clear to auscultation BL without crackles or wheezes.   Heart:  RRR. S1 and S2 normal without gallop, murmur, or rubs.   Extremities:  No pretibial edema.   Neurologic:  A&O X3, CN II - XII are grossly intact. Moving all 4 extremities well. Sensations intact to light touch.   R 5th toe has small abrasion is now wrapped in gauze.  Just minor soreness with passive movement of the toe and palpation around the area.   Associated hematoma over top of L foot improving.    Lab Results: Basic  Metabolic Panel:  Lab 11/03/11 1610 11/02/11 0716  NA 133* 136  K 4.3 5.3*  CL 96 99  CO2 26 21  GLUCOSE 291* 366*  BUN 8 13  CREATININE 0.55 0.64  CALCIUM 9.0 9.0  MG -- --  PHOS -- --   Liver Function Tests:  Lab 11/01/11 0909  AST 17  ALT 6  ALKPHOS 93  BILITOT 0.5  PROT 6.4  ALBUMIN 3.4*   CBC:  Lab 11/03/11 0630 11/02/11 0716 11/01/11 0559  WBC 9.8 13.3* --  NEUTROABS -- 10.1* 10.7*  HGB 11.2* 12.3 --  HCT 33.1* 37.0 --  MCV 86.4 90.2 --  PLT 271 310 --   Cardiac Enzymes:  Lab 11/02/11 0054 11/01/11 1551 11/01/11 0910  CKTOTAL 43 44 54  CKMB 1.7 1.7 1.6  CKMBINDEX -- -- --  TROPONINI <0.30 <0.30 <0.30   CBG:  Lab 11/03/11 0726 11/02/11 2113 11/02/11 1650 11/02/11 1141 11/02/11 0720 11/01/11 2219  GLUCAP 283* 249* 259* 251* 334* 229*   Hemoglobin A1C:  Lab 11/01/11 0909  HGBA1C 8.2*   Urinalysis:  Lab 11/01/11 1335  COLORURINE YELLOW  LABSPEC 1.011  PHURINE 6.5  GLUCOSEU 500*  HGBUR NEGATIVE  BILIRUBINUR NEGATIVE  KETONESUR NEGATIVE  PROTEINUR NEGATIVE  UROBILINOGEN 1.0  NITRITE NEGATIVE  LEUKOCYTESUR SMALL*   Micro  Results: Recent Results (from the past 240 hour(s))  URINE CULTURE     Status: Normal   Collection Time   11/01/11  1:35 PM      Component Value Range Status Comment   Specimen Description URINE, RANDOM   Final    Special Requests Normal   Final    Culture  Setup Time [id number]   Final    Colony Count NO GROWTH   Final    Culture NO GROWTH   Final    Report Status 11/02/2011 FINAL   Final    Studies/Results: Dg Cervical Spine 1 View  11/02/2011  *RADIOLOGY REPORT*  Clinical Data: Status post cervical fusion.  DG CERVICAL SPINE - 1 VIEW  Comparison: MR cervical spine 08/31/2011.  Findings: Since the MRI, the patient has undergone C5-7 ACDF. Vertebral body height and alignment are maintained.  Hardware is intact.  IMPRESSION: C5-7 ACDF.  No acute finding.  Original Report Authenticated By: Bernadene Bell. Maricela Curet, [name]    Medications: I have reviewed the patient's current medications. Scheduled Meds:   . docusate sodium  100 mg Oral BID  . DULoxetine  120 mg Oral Daily  . enoxaparin  40 mg Subcutaneous Q24H  . insulin aspart  0-9 Units Subcutaneous TID WC  . insulin aspart  4 Units Subcutaneous TID WC  . insulin glargine  22 Units Subcutaneous Daily  . omeprazole  20 mg Oral Q1200  . oxyCODONE  5 mg Oral Once  . simvastatin  5 mg Oral q1800  . sodium chloride  3 mL Intravenous Q12H  . DISCONTD: omeprazole  20 mg Oral Daily   Continuous Infusions:   . sodium chloride 150 mL/hr at 11/02/11 0256   PRN Meds:.clonazePAM, ondansetron (ZOFRAN) IV, ondansetron, oxyCODONE-acetaminophen, DISCONTD:  morphine injection  Assessment/Plan:  This is a 56 year old woman with history of recent cervical spine surgery who presents for multiple un witnessed syncopal episodes.   Syncope - Questionable syncopal episodes as described by patient. Differential includes medication related as patient was taking anti-anxiety and pain medication on a scheduled basis with minimal oral intake vs autonomic dysfunction given history of DM, orthostatic hypotension as patient was still taking anti-hypertensives despite poor oral intake vs arrhythmia vs vasovagal. Of note, CT head within normal limits. Orthostatic vitals done last on admission are suspicious for error recording (57 pulse supine same as diastolic BP and out of line with other pulses here), not orthostatic yesterday morning after IVF. Unsure if she was orthostatic on admission. IVFs discontinued. Echo showed normal EF, left atrium upper limits of normal size. Seems that medications are most likely cause of her episodes at home ("took between 2-4 Klonopins, a Percocet, and Cymbalta" all at once).    Improvement in her subjective mental status since yesterday also makes medications most likely etiology of her falls as we have gotten her off IV narcotics on to a lower dose of  Percocet for pain control.   Plan:  -continue telemetry  -PT/OT  -SW or case management consult tomorrow to arrange for SNF for PT/OT  History of Recent Surgical Spine Surgical, subsequent falls:  -Can shower whenever she wants, keep on collar -Dr. Lovell Sheehan to see her today.  C spine XRay showed hardware intact.   -Continue percocet for pain control for now.  Start ibuprofen for adjunctive pain control.   -Her perception of her pain is possibly influenced by her mood, so will continue cymbalta  DM Type 1 - Lantus and meal time novolog  plus sliding scale while here since she is not cognating consistently well enough to use her insulin pump.  If she continues to do well like today we could switch back to the pump.  On lantus and novolog her sugars have been too high, in 200s consistently.  Will consider increasing lantus dose.    HTN - Consider restarting benicar if BPs continue to be borderline high since she seems to be good volume wise and BP 144/93 this morning.    Fibromyalgia - Percocet, ibuprofen, cymbalta  Leukocytosis - WBC 9.8 improved from admission.    LOS: 2 days   Brooke Avery 11/03/2011, 8:34 AM

## 2011-11-03 NOTE — Progress Notes (Signed)
Internal Medicine Teaching Service Attending Note Date: 11/03/2011  Patient name: Brooke Avery  Medical record number: [id number]  Date of birth: 1955/11/19    This patient has been seen and discussed with the house staff. Please see their note for complete details. I concur with their findings with the following additions/corrections:  Brooke Avery looks great today. She is enthusiastic about going to SNF prior to her return to home. She is off IV opioids and pain controlled on home oxycodone. Dr Lovell Sheehan has eval her surgical site. She is stable to be D/C 'd to SNF once bed available.  Brooke Avery 11/03/2011, 11:42 AM

## 2011-11-03 NOTE — Progress Notes (Signed)
Inpatient Diabetes Program Recommendations  AACE/ADA: New Consensus Statement on Inpatient Glycemic Control (2009)  Target Ranges:  Prepandial:   less than 140 mg/dL      Peak postprandial:   less than 180 mg/dL (1-2 hours)      Critically ill patients:  140 - 180 mg/dL   Reason for Visit: Patient with history of Type 1 diabetes and insulin pump.  Agree with increase in Lantus dose and meal coverage today.  Will follow.

## 2011-11-03 NOTE — Progress Notes (Signed)
Clinical Social Work Department BRIEF PSYCHOSOCIAL ASSESSMENT 11/03/2011  Patient:  Brooke Avery, Brooke Avery     Account Number:  [id number]     Admit date:  11/01/2011  Clinical Social Worker:  Conley Simmonds  Date/Time:  11/03/2011 01:45 PM  Referred by:  Physician  Date Referred:  11/03/2011 Referred for  SNF Placement   Other Referral:   Interview type:  Patient Other interview type:    PSYCHOSOCIAL DATA Living Status:  ALONE Admitted from facility:   Level of care:   Primary support name:  None Noted by patient Primary support relationship to patient:   Degree of support available:   Lacking    CURRENT CONCERNS  Other Concerns:    SOCIAL WORK ASSESSMENT / PLAN CSW met with patient at bedside to discuss d/c planning. Pt very pleasant and agreeable to [facility]. CSW reviewed placement purpose and process. CSW inquired as to wether or not pt wanted csw to  contact anyone and Pt stated that she had "no one" to help her make decisions. CSW   Assessment/plan status:  Information/Referral to [facility] Other assessment/ plan:   Information/referral to community resources:   [facility] list    PATIENT'S/FAMILY'S RESPONSE TO PLAN OF CARE: Pt very amenable to SNF and appreciative of CSW and staff concern and intervention-CSW discussed various factors in decision making and discussed locations of SNF in relation to her limited circle of support and what may be the easiest in terms of visiting. CSW will initiate bed search and facilitate d/c planning.  Jodean Lima, [phone]

## 2011-11-03 NOTE — Progress Notes (Signed)
Pt's IV came out. MD on call made aware and stated it was oh k to leave out IV. Will continue to monitor pt.

## 2011-11-03 NOTE — Progress Notes (Signed)
Patient ID: Brooke Avery, female   DOB: 1955/11/25, 56 y.o.   MRN: [id number] Subjective:  The patient is alert and pleasant. Her neck is appropriately sore. She looks well.  Objective: Vital signs in last 24 hours: Temp:  [97.9 F (36.6 C)-99.1 F (37.3 C)] 97.9 F (36.6 C) (04/17 0800) Pulse Rate:  [70-95] 89  (04/17 0800) Resp:  [16-18] 18  (04/17 0800) BP: (112-144)/(49-93) 122/72 mmHg (04/17 0800) SpO2:  [83 %-100 %] 83 % (04/17 0929)  Intake/Output from previous day:   Intake/Output this shift:    Physical exam the patient is alert and oriented x3. Her incision is healing well without signs of infection,, midline shift, hematoma. Her strength is grossly normal in all 4 extremities.  I reviewed the patient's cervical x-rays done yesterday. The demonstrating good position of this instrumentation without evidence of significant swelling.  Lab Results:  Basename 11/03/11 0630 11/02/11 0716  WBC 9.8 13.3*  HGB 11.2* 12.3  HCT 33.1* 37.0  PLT 271 310   BMET  Basename 11/03/11 0630 11/02/11 0716  NA 133* 136  K 4.3 5.3*  CL 96 99  CO2 26 21  GLUCOSE 291* 366*  BUN 8 13  CREATININE 0.55 0.64  CALCIUM 9.0 9.0    Studies/Results: Dg Cervical Spine 1 View  11/02/2011  *RADIOLOGY REPORT*  Clinical Data: Status post cervical fusion.  DG CERVICAL SPINE - 1 VIEW  Comparison: MR cervical spine 08/31/2011.  Findings: Since the MRI, the patient has undergone C5-7 ACDF. Vertebral body height and alignment are maintained.  Hardware is intact.  IMPRESSION: C5-7 ACDF.  No acute finding.  Original Report Authenticated By: Bernadene Bell. Maricela Curet, [name]    Assessment/Plan: Status post C5-6 and C6-7 intracervical discectomy, fusion, and plating: The patient and her x-rays look good.  Frequent falls: I suspect this is secondary to the patient's baseline status and multiple medications. I would suggest PT and OT consults.    LOS: 2 days     Dreux Mcgroarty D 11/03/2011, 10:54  AM

## 2011-11-04 DIAGNOSIS — R269 Unspecified abnormalities of gait and mobility: Secondary | ICD-10-CM | POA: Diagnosis not present

## 2011-11-04 DIAGNOSIS — R279 Unspecified lack of coordination: Secondary | ICD-10-CM | POA: Diagnosis not present

## 2011-11-04 DIAGNOSIS — E109 Type 1 diabetes mellitus without complications: Secondary | ICD-10-CM | POA: Diagnosis not present

## 2011-11-04 DIAGNOSIS — IMO0002 Reserved for concepts with insufficient information to code with codable children: Secondary | ICD-10-CM | POA: Diagnosis not present

## 2011-11-04 DIAGNOSIS — R55 Syncope and collapse: Secondary | ICD-10-CM | POA: Diagnosis not present

## 2011-11-04 DIAGNOSIS — N882 Stricture and stenosis of cervix uteri: Secondary | ICD-10-CM | POA: Diagnosis not present

## 2011-11-04 DIAGNOSIS — M6281 Muscle weakness (generalized): Secondary | ICD-10-CM | POA: Diagnosis not present

## 2011-11-04 LAB — GLUCOSE, CAPILLARY
Glucose-Capillary: 234 mg/dL — ABNORMAL HIGH (ref 70–99)
Glucose-Capillary: 268 mg/dL — ABNORMAL HIGH (ref 70–99)

## 2011-11-04 MED ORDER — OXYCODONE-ACETAMINOPHEN 5-325 MG PO TABS: 1.0000 | ORAL_TABLET | Freq: Four times a day (QID) | ORAL | Status: AC | PRN

## 2011-11-04 MED ORDER — IBUPROFEN 600 MG PO TABS: 600.0000 mg | ORAL_TABLET | Freq: Three times a day (TID) | ORAL | Status: AC | PRN

## 2011-11-04 MED ORDER — INSULIN ASPART 100 UNIT/ML ~~LOC~~ SOLN: 5.0000 [IU] | Freq: Three times a day (TID) | SUBCUTANEOUS | Status: DC

## 2011-11-04 MED ORDER — INSULIN GLARGINE 100 UNIT/ML ~~LOC~~ SOLN: 24.0000 [IU] | Freq: Every day | SUBCUTANEOUS | Status: DC

## 2011-11-04 NOTE — Progress Notes (Signed)
Internal Medicine Teaching Service Attending Note Date: 11/04/2011  Patient name: Brooke Avery  Medical record number: [id number]  Date of birth: [birth year]    This patient has been seen and discussed with the house staff. Please see their note for complete details. I concur with their findings with the following additions/corrections:  [name] is sitting SOB and reading newspaper. She c/o muscle spasms that are part of her chronic pain syndrome. She is medically stable for transfer to SNF once bed is available.   BUTCHER,ELIZABETH 11/04/2011, 9:06 AM

## 2011-11-04 NOTE — Progress Notes (Signed)
Pt discharged to SNF via ambulance. Reviewed discharge instructions with no further questions. Duwaine Maxin, RN

## 2011-11-04 NOTE — Progress Notes (Signed)
Inpatient Diabetes Program Recommendations  AACE/ADA: New Consensus Statement on Inpatient Glycemic Control (2009)  Target Ranges:  Prepandial:   less than 140 mg/dL      Peak postprandial:   less than 180 mg/dL (1-2 hours)      Critically ill patients:  140 - 180 mg/dL   Reason for Visit: Note patient to discharge to SNF.  She is currently on basal/bolus insulin regimen in hospital.  Note plans to restart insulin pump today, however patient did get Lantus this morning at 10 am therefore would not restart insulin pump until the AM.  Called social worker to clarify whether SNF will accept patient with insulin pump.  She will call and find out.  If not, patient may need to continue basal/bolus regimen until she is discharged home from SNF.  Her endocrinologist is Dr. Talmage Nap.

## 2011-11-04 NOTE — Progress Notes (Signed)
Clinical Social Work-CSW facilitated pt d/c to SNF [facility]) with chart copy, FL2, d/c summary and AVS-no further needs sign off- Bonnye Fava, MSW, [phone]

## 2011-11-04 NOTE — Discharge Summary (Signed)
Internal Medicine Teaching [facility] Discharge Note  Name: Brooke Avery MRN: [id number] DOB: 10/22/1955 56 y.o.  Date of Admission: 11/01/2011  5:24 AM Date of Discharge: 11/04/2011 Attending Physician: Burns Spain, MD  Discharge Diagnosis: Syncope  DIABETES MELLITUS, TYPE I Stenosis, cervical spine, s/p cervical spine surgery 10/27/2011 Fibromyalgia Mood Disorder HLD HTN GERD  Discharge Medications: Medication List  As of 11/04/2011 12:12 PM   STOP taking these medications         diazepam 5 MG tablet      hydrOXYzine 25 MG tablet      tapentadol 50 MG Tabs         TAKE these medications         clonazePAM 1 MG tablet   Commonly known as: KLONOPIN   Take 1 mg by mouth 3 (three) times daily as needed. For anxiety      DSS 100 MG Caps   Take 100 mg by mouth 2 (two) times daily.      DULoxetine 60 MG capsule   Commonly known as: CYMBALTA   Take 120 mg by mouth daily.      ibuprofen 600 MG tablet   Commonly known as: ADVIL,MOTRIN   Take 1 tablet (600 mg total) by mouth every 8 (eight) hours as needed for pain.      insulin aspart 100 UNIT/ML injection   Commonly known as: novoLOG   Inject 5 Units into the skin 3 (three) times daily with meals.      insulin glargine 100 UNIT/ML injection   Commonly known as: LANTUS   Inject 24 Units into the skin daily.      olmesartan 20 MG tablet   Commonly known as: BENICAR   Take 20 mg by mouth daily.      omeprazole 20 MG tablet   Commonly known as: PRILOSEC OTC   Take 20 mg by mouth daily.      oxyCODONE-acetaminophen 5-325 MG per tablet   Commonly known as: PERCOCET   Take 1 tablet by mouth every 6 (six) hours as needed for pain.      pravastatin 80 MG tablet   Commonly known as: PRAVACHOL   Take 80 mg by mouth daily.            Disposition and follow-up:   Brooke Avery was discharged from [facility] in Stable condition.  She should fu with SNF PCP within 1 week to make  re-evaluation of her pain regimen and to address her blood sugars while at SNF (and adjust insulin appropriately).  She has fu Appt 11/19/11 at 8:20am with [facility] who performed her surgery on 10/27/11.  Consultations:  Neurosurgery  Procedures Performed:  Dg Chest 2 View  11/01/2011  *RADIOLOGY REPORT*  Clinical Data: Syncope  CHEST - 2 VIEW  Comparison: 10/19/2011  Findings: Lungs are essentially clear. No pleural effusion or pneumothorax.  Cardiomediastinal silhouette is within normal limits.  Visualized osseous structures are within normal limits.  Cervical spine fixation hardware.  IMPRESSION: No evidence of acute cardiopulmonary disease.  Original Report Authenticated By: Charline Bills, [name]   Dg Chest 2 View  10/19/2011  *RADIOLOGY REPORT*  Clinical Data: Preoperative assessment for ACDF, stenosis, history hypertension, diabetes, smoker  CHEST - 2 VIEW  Comparison: 08/12/2010  Findings: Normal heart size, mediastinal contours, and pulmonary vascularity. Chronic bronchitic changes. No acute infiltrate, pleural effusion, or pneumothorax. Bones appear demineralized.  IMPRESSION: Chronic bronchitic changes.  Original Report Authenticated By:  Lollie Marrow, [name]   Dg Cervical Spine 1 View  11/02/2011  *RADIOLOGY REPORT*  Clinical Data: Status post cervical fusion.  DG CERVICAL SPINE - 1 VIEW  Comparison: MR cervical spine 08/31/2011.  Findings: Since the MRI, the patient has undergone C5-7 ACDF. Vertebral body height and alignment are maintained.  Hardware is intact.  IMPRESSION: C5-7 ACDF.  No acute finding.  Original Report Authenticated By: Bernadene Bell. D'ALESSIO, [name]   Dg Cervical Spine 2-3 Views  10/27/2011  *RADIOLOGY REPORT*  Clinical Data: C5-6 and C6-7 ACDF  CERVICAL SPINE - 2-3 VIEW  Comparison: 08/31/2011  Findings: Initial film shows a needle marking the anterior disc space of C4-5.  Second film shows anterior cervical discectomy and fusion from C5-C7.  Interbody fusion  material is in place.  There is a plate anteriorly with screw fixation.  Lower aspect is poorly seen because of shoulder density.  Sponges remain along the operative approach.  IMPRESSION: ACDF C5-C7.  Original Report Authenticated By: Thomasenia Sales, [name]   Ct Head Wo Contrast  11/01/2011  *RADIOLOGY REPORT*  Clinical Data: Status post multiple syncopal episodes and falls.  CT HEAD WITHOUT CONTRAST  Technique:  Contiguous axial images were obtained from the base of the skull through the vertex without contrast.  Comparison: CT of the head performed 10/31/2009  Findings: There is no evidence of acute infarction, mass lesion, or intra- or extra-axial hemorrhage on CT.  Prominence of the sulci suggests mild cortical volume loss. Scattered periventricular white matter change likely reflects small vessel ischemic microangiopathy.  The posterior fossa, including the cerebellum, brainstem and fourth ventricle, is within normal limits.  The third and lateral ventricles, and basal ganglia are unremarkable in appearance.  The cerebral hemispheres demonstrate grossly normal gray-white differentiation.  No mass effect or midline shift is seen.  There is no evidence of fracture; visualized osseous structures are unremarkable in appearance.  The visualized portions of the orbits are within normal limits.  The paranasal sinuses and mastoid air cells are well-aerated.  No significant soft tissue abnormalities are seen.  IMPRESSION:  1.  No evidence of traumatic intracranial injury or fracture. 2.  Mild cortical volume loss and scattered small vessel ischemic microangiopathy.  Original Report Authenticated By: Tonia Ghent, [name]   Dg Knee Complete 4 Views Left  11/01/2011  *RADIOLOGY REPORT*  Clinical Data: Fall, knee pain  LEFT KNEE - COMPLETE 4+ VIEW  Comparison: None.  Findings: No fracture or dislocation is seen.  Joint spaces are essentially preserved.  Very mild patellofemoral spurring.  The visualized soft tissues are  unremarkable.  No suprapatellar knee joint effusion.  IMPRESSION: No fracture or dislocation is seen.  Original Report Authenticated By: Charline Bills, [name]   Dg Knee Complete 4 Views Right  11/01/2011  *RADIOLOGY REPORT*  Clinical Data: Fall, knee pain  RIGHT KNEE - COMPLETE 4+ VIEW  Comparison: None.  Findings: No fracture or dislocation is seen.  The joint spaces are essentially preserved.  The visualized soft tissues are unremarkable.  No suprapatellar knee joint effusion.  IMPRESSION: No fracture or dislocation is seen.  Original Report Authenticated By: Charline Bills, [name]    2D Echo:  Study Conclusions  11/01/2011  Left ventricle: The cavity size was normal. Wall thickness was increased in a pattern of mild LVH. Systolic function was vigorous. The estimated ejection fraction was in the range of 65% to 70%. Doppler parameters are consistent with abnormal left ventricular relaxation (grade 1 diastolic dysfunction).  Admission HPI:  Patient is a 56 y.o. female with a complex PMHx of Cervical stenosis of the spine s/p recent surgery 10/27/2011, DM Type 1, fibromyalgia, and HTN who presents to [facility] for evaluation of dizzy spells and passing out. Patient reports multiple episodes of syncope at home today. She notes that she was trying to go from the bed to the computer and just fell down and lost consciousness. She notes that she would wake up and try to get up and it would pass out again. She notes that she did this repeatedly and was unable to get up and get to the phone after the first few times. She ultimately was able to get to her phone and call 911. EMS placed the patient on a backboard and she was already in a c-collar from recent cervical spine surgery in the last week. Patient complains of diffuse pain but worse in her knees where she hit her knees. She denies shortness of breath or nausea at this time. No fevers at home. No weakness or numbness in her arms or legs. No prior  cardiac history.  Patient reports she lives alone and has not been eating adequately. She reports drinking milkshakes only since discharge and with them taking her clonazepam and pain medication. She was discharged on diazepam and nucynta, but did not get these medications filled due to affordability.    Hospital Course by problem list:  Syncope: Questionable syncopal episodes as described by patient. Most likely due to her taking too much of her medicatons.  Reported taking 2-4 tablets of Klonopin, a Cymbalta, and a Percocet all at once preceding her multiple recurrent episodes of syncope and struggling to crawl/walk around her house trying to find her cell phone to call for help.  Patient was demanding pain medications on admission and became more altered with slurred speech and some incoherence after receiving morphine.  Over the next day she greatly improved after pain control was switched to her home Oxycodone-Acetaminophen.  She reported adequate pain control and had coherent conversations with clear speech.  Decreased PO intake may have contributed following her recent C-spine surgery on 10/27/11.    CT head within normal limits.  Echo showed normal EF, left atrium upper limits of normal size.   She was evaluated by Dr. Lovell Sheehan of neurosurgery who performed her surgery.  C-spine XRay showed intact hardware and he felt that her soreness was appropriate for this postoperative period.    PT/OT evaluated patient and felt she would benefit from SNF placement.  Patient agreed as she did not feel safe managing herself and her medications right now postoperatively given the fact that she lives alone and had these episodes requiring re-admission.  Patient is being discharged to SNF for short term rehab.    Cervical Spine Stenosis / s/p Cervical Spine C5-6 and C6-7 anterior cervical discectomy/fusion/plating 10/27/11:  Patient has history of chronic pain in neck, arms, legs and numbness in hands and feet.   Underwent cervical spine C5-6 and C6-7 anterior cervical discectomy/fusion/plating on 10/27/11 by Dr. Lovell Sheehan.  Was evaluated during this readmission by him since she reports hitting her ear with one of the falls.  C spine XRay showed hardware intact and her soreness was felt to be appropriate for this postoperative period.  She has follow-up scheduled with [facility] May 3. 2013 8:20am.  They have recommended  that patient wear C collar at all times except while showering until her follow-up appointment with them on May 3rd.  Pain control with percocet and ibuprofen has proven sufficient in the hospital and will be continued on discharge.    Diabetes Mellitus Type 1: HbA1c 8.2% on admission.  Uses insulin pump at home.  Due to her altered status from likely taking too many meds on admission, it was not felt that she could reliably use her pump, so she was switched to Lantus and meal time novolog as an inpatient.   She will continue on lantus and mealtime novolog at SNF, and she could be switched back to her insulin pump when she leaves SNF for her home pending SNF PCP's recommendation.  Monitor sugars at SNF and SNF PCP should adjust insulin doses as needed.  Discharge Vitals:  BP 126/65  Pulse 73  Temp(Src) 98.1 F (36.7 C) (Oral)  Resp 16  Ht 5\' 7"  (1.702 m)  Wt 162 lb 4.8 oz (73.619 kg)  BMI 25.42 kg/m2  SpO2 92%  Discharge Labs:  Results for orders placed during the hospital encounter of 11/01/11 (from the past 24 hour(s))  GLUCOSE, CAPILLARY     Status: Abnormal   Collection Time   11/03/11 11:50 AM      Component Value Range   Glucose-Capillary 188 (*) 70 - 99 (mg/dL)   Comment 1 Notify RN    GLUCOSE, CAPILLARY     Status: Abnormal   Collection Time   11/03/11  4:39 PM      Component Value Range   Glucose-Capillary 172 (*) 70 - 99 (mg/dL)  GLUCOSE, CAPILLARY     Status: Abnormal   Collection Time   11/03/11  8:41 PM      Component Value Range   Glucose-Capillary 229  (*) 70 - 99 (mg/dL)   Comment 1 Notify RN    GLUCOSE, CAPILLARY     Status: Abnormal   Collection Time   11/04/11  7:24 AM      Component Value Range   Glucose-Capillary 234 (*) 70 - 99 (mg/dL)   Comment 1 Notify RN     Admission on 11/01/2011  Component Date Value Range Status  . WBC (K/uL) 11/01/2011 14.3* 4.0-10.5 Final   WHITE COUNT CONFIRMED ON SMEAR  . RBC (MIL/uL) 11/01/2011 4.60  3.87-5.11 Final  . Hemoglobin (g/dL) 16/04/9603 54.0  98.1-19.1 Final  . HCT (%) 11/01/2011 40.2  36.0-46.0 Final  . MCV (fL) 11/01/2011 87.4  78.0-100.0 Final  . MCH (pg) 11/01/2011 30.0  26.0-34.0 Final  . MCHC (g/dL) 47/82/9562 13.0  86.5-78.4 Final  . RDW (%) 11/01/2011 12.3  11.5-15.5 Final  . Platelets (K/uL) 11/01/2011 297  150-400 Final   Comment: PLATELET COUNT CONFIRMED BY SMEAR                          LARGE PLATELETS PRESENT  . Neutrophils Relative (%) 11/01/2011 74  43-77 Final  . Lymphocytes Relative (%) 11/01/2011 17  12-46 Final  . Monocytes Relative (%) 11/01/2011 8  3-12 Final  . Eosinophils Relative (%) 11/01/2011 1  0-5 Final  . Basophils Relative (%) 11/01/2011 0  0-1 Final  . Neutro Abs (K/uL) 11/01/2011 10.7* 1.7-7.7 Final  . Lymphs Abs (K/uL) 11/01/2011 2.4  0.7-4.0 Final  . Monocytes Absolute (K/uL) 11/01/2011 1.1* 0.1-1.0 Final  . Eosinophils Absolute (K/uL) 11/01/2011 0.1  0.0-0.7 Final  . Basophils Absolute (K/uL) 11/01/2011 0.0  0.0-0.1 Final  . Sodium (mEq/L) 11/01/2011 135  135-145 Final  . Potassium (mEq/L) 11/01/2011 4.4  3.5-5.1 Final  . Chloride (  mEq/L) 11/01/2011 97  96-112 Final  . CO2 (mEq/L) 11/01/2011 25  19-32 Final  . Glucose, Bld (mg/dL) 47/82/9562 130* 86-57 Final  . BUN (mg/dL) 84/69/6295 17  2-84 Final  . Creatinine, Ser (mg/dL) 13/24/4010 2.72  5.36-6.44 Final  . Calcium (mg/dL) 03/47/4259 9.5  5.6-38.7 Final  . GFR calc non Af Amer (mL/min) 11/01/2011 71* >90 Final  . GFR calc Af Amer (mL/min) 11/01/2011 82* >90 Final   Comment:                                  The eGFR has been calculated                          using the CKD EPI equation.                          This calculation has not been                          validated in all clinical                          situations.                          eGFR's persistently                          <90 mL/min signify                          possible Chronic Kidney Disease.  . Total CK (U/L) 11/01/2011 56  7-177 Final  . CK, MB (ng/mL) 11/01/2011 1.7  0.3-4.0 Final  . Troponin I (ng/mL) 11/01/2011 <0.30  <0.30 Final   Comment:                                 Due to the release kinetics of cTnI,                          a negative result within the first hours                          of the onset of symptoms does not rule out                          myocardial infarction with certainty.                          If myocardial infarction is still suspected,                          repeat the test at appropriate intervals.  . Relative Index  11/01/2011 RELATIVE INDEX IS INVALID  0.0-2.5 Final   Comment: WHEN CK < 100 U/L                                  . Color,  Urine  11/01/2011 YELLOW  YELLOW Final  . APPearance  11/01/2011 CLEAR  CLEAR Final  . Specific Gravity, Urine  11/01/2011 1.011  1.005-1.030 Final  . pH  11/01/2011 6.5  5.0-8.0 Final  . Glucose, UA (mg/dL) 16/04/9603 540* NEGATIVE Final  . Hgb urine dipstick  11/01/2011 NEGATIVE  NEGATIVE Final  . Bilirubin Urine  11/01/2011 NEGATIVE  NEGATIVE Final  . Ketones, ur (mg/dL) 98/05/9146 NEGATIVE  NEGATIVE Final  . Protein, ur (mg/dL) 82/95/6213 NEGATIVE  NEGATIVE Final  . Urobilinogen, UA (mg/dL) 08/65/7846 1.0  9.6-2.9 Final  . Nitrite  11/01/2011 NEGATIVE  NEGATIVE Final  . Leukocytes, UA  11/01/2011 SMALL* NEGATIVE Final  . Total Protein (g/dL) 52/84/1324 6.4  4.0-1.0 Final  . Albumin (g/dL) 27/25/3664 3.4* 4.0-3.4 Final  . AST (U/L) 11/01/2011 17  0-37 Final  . ALT (U/L) 11/01/2011 6  0-35 Final  . Alkaline  Phosphatase (U/L) 11/01/2011 93  39-117 Final  . Total Bilirubin (mg/dL) 74/25/9563 0.5  8.7-5.6 Final  . Bilirubin, Direct (mg/dL) 43/32/9518 0.1  8.4-1.6 Final  . Indirect Bilirubin (mg/dL) 60/63/0160 0.4  1.0-9.3 Final  . Total CK (U/L) 11/01/2011 54  7-177 Final  . CK, MB (ng/mL) 11/01/2011 1.6  0.3-4.0 Final  . Troponin I (ng/mL) 11/01/2011 <0.30  <0.30 Final   Comment:                                 Due to the release kinetics of cTnI,                          a negative result within the first hours                          of the onset of symptoms does not rule out                          myocardial infarction with certainty.                          If myocardial infarction is still suspected,                          repeat the test at appropriate intervals.  . Relative Index  11/01/2011 RELATIVE INDEX IS INVALID  0.0-2.5 Final   Comment: WHEN CK < 100 U/L                                  . Total CK (U/L) 11/01/2011 44  7-177 Final  . CK, MB (ng/mL) 11/01/2011 1.7  0.3-4.0 Final  . Troponin I (ng/mL) 11/01/2011 <0.30  <0.30 Final   Comment:                                 Due to the release kinetics of cTnI,                          a negative result within the first hours  of the onset of symptoms does not rule out                          myocardial infarction with certainty.                          If myocardial infarction is still suspected,                          repeat the test at appropriate intervals.  . Relative Index  11/01/2011 RELATIVE INDEX IS INVALID  0.0-2.5 Final   Comment: WHEN CK < 100 U/L                                  . Hemoglobin A1C (%) 11/01/2011 8.2* <5.7 Final   Comment: (NOTE)                                                                                                                         According to the ADA Clinical Practice Recommendations for 2011, when                          HbA1c is used as a screening  test:                           >=6.5%   Diagnostic of Diabetes Mellitus                                    (if abnormal result is confirmed)                          5.7-6.4%   Increased risk of developing Diabetes Mellitus                          References:Diagnosis and Classification of Diabetes Mellitus,Diabetes                          Care,2011,34(Suppl 1):S62-S69 and Standards of Medical Care in                                  Diabetes - 2011,Diabetes Care,2011,34 (Suppl 1):S11-S61.  . Mean Plasma Glucose (mg/dL) 40/98/1191 478* <295 Final  . Specimen Description  11/01/2011 URINE, RANDOM   Final  . Special Requests  11/01/2011 Normal   Final  . Culture  Setup Time  11/01/2011 [id number]   Final  . Colony Count  11/01/2011 NO GROWTH   Final  . Culture  11/01/2011 NO GROWTH  Final  . Report Status  11/01/2011 11/02/2011 FINAL   Final  . Glucose-Capillary (mg/dL) 42/35/3614 431* 54-00 Final  . Glucose-Capillary (mg/dL) 86/76/1950 932* 67-12 Final  . Total CK (U/L) 11/02/2011 43  7-177 Final  . CK, MB (ng/mL) 11/02/2011 1.7  0.3-4.0 Final  . Troponin I (ng/mL) 11/02/2011 <0.30  <0.30 Final   Comment:                                 Due to the release kinetics of cTnI,                          a negative result within the first hours                          of the onset of symptoms does not rule out                          myocardial infarction with certainty.                          If myocardial infarction is still suspected,                          repeat the test at appropriate intervals.  . Relative Index  11/02/2011 RELATIVE INDEX IS INVALID  0.0-2.5 Final   Comment: WHEN CK < 100 U/L                                  . Squamous Epithelial / LPF  11/01/2011 RARE  RARE Final  . WBC, UA (WBC/hpf) 11/01/2011 3-6  <3 Final  . Bacteria, UA  11/01/2011 RARE  RARE Final  . Glucose-Capillary (mg/dL) 45/80/9983 382* 50-53 Final  . Comment 1  11/01/2011 Documented in Chart    Final  . Glucose-Capillary (mg/dL) 97/67/3419 379* 02-40 Final  . WBC (K/uL) 11/02/2011 13.3* 4.0-10.5 Final  . RBC (MIL/uL) 11/02/2011 4.10  3.87-5.11 Final  . Hemoglobin (g/dL) 97/35/3299 24.2  68.3-41.9 Final  . HCT (%) 11/02/2011 37.0  36.0-46.0 Final  . MCV (fL) 11/02/2011 90.2  78.0-100.0 Final  . MCH (pg) 11/02/2011 30.0  26.0-34.0 Final  . MCHC (g/dL) 62/22/9798 92.1  19.4-17.4 Final  . RDW (%) 11/02/2011 12.2  11.5-15.5 Final  . Platelets (K/uL) 11/02/2011 310  150-400 Final  . Neutrophils Relative (%) 11/02/2011 76  43-77 Final  . Neutro Abs (K/uL) 11/02/2011 10.1* 1.7-7.7 Final  . Lymphocytes Relative (%) 11/02/2011 14  12-46 Final  . Lymphs Abs (K/uL) 11/02/2011 1.8  0.7-4.0 Final  . Monocytes Relative (%) 11/02/2011 7  3-12 Final  . Monocytes Absolute (K/uL) 11/02/2011 0.9  0.1-1.0 Final  . Eosinophils Relative (%) 11/02/2011 4  0-5 Final  . Eosinophils Absolute (K/uL) 11/02/2011 0.5  0.0-0.7 Final  . Basophils Relative (%) 11/02/2011 0  0-1 Final  . Basophils Absolute (K/uL) 11/02/2011 0.1  0.0-0.1 Final  . Sodium (mEq/L) 11/02/2011 136  135-145 Final  . Potassium (mEq/L) 11/02/2011 5.3* 3.5-5.1 Final   NO VISIBLE HEMOLYSIS  . Chloride (mEq/L) 11/02/2011 99  96-112 Final  . CO2 (mEq/L) 11/02/2011 21  19-32 Final  . Glucose, Bld (mg/dL) 03/01/4817 563* 14-97 Final  .  BUN (mg/dL) 40/98/1191 13  4-78 Final  . Creatinine, Ser (mg/dL) 29/56/2130 8.65  7.84-6.96 Final  . Calcium (mg/dL) 29/52/8413 9.0  2.4-40.1 Final  . GFR calc non Af Amer (mL/min) 11/02/2011 >90  >90 Final  . GFR calc Af Amer (mL/min) 11/02/2011 >90  >90 Final   Comment:                                 The eGFR has been calculated                          using the CKD EPI equation.                          This calculation has not been                          validated in all clinical                          situations.                          eGFR's persistently                          <90  mL/min signify                          possible Chronic Kidney Disease.  . Glucose-Capillary (mg/dL) 02/72/5366 440* 34-74 Final  . Glucose-Capillary (mg/dL) 25/95/6387 564* 33-29 Final  . Comment 1  11/02/2011 Notify RN   Final  . Glucose-Capillary (mg/dL) 51/88/4166 063* 01-60 Final  . Glucose-Capillary (mg/dL) 10/93/2355 732* 20-25 Final  . Comment 1  11/02/2011 Notify RN   Final  . Glucose-Capillary (mg/dL) 42/70/6237 628* 31-51 Final  . Comment 1  11/02/2011 Notify RN   Final  . WBC (K/uL) 11/03/2011 9.8  4.0-10.5 Final  . RBC (MIL/uL) 11/03/2011 3.83* 3.87-5.11 Final  . Hemoglobin (g/dL) 76/16/0737 10.6* 26.9-48.5 Final  . HCT (%) 11/03/2011 33.1* 36.0-46.0 Final  . MCV (fL) 11/03/2011 86.4  78.0-100.0 Final  . MCH (pg) 11/03/2011 29.2  26.0-34.0 Final  . MCHC (g/dL) 46/27/0350 09.3  81.8-29.9 Final  . RDW (%) 11/03/2011 11.8  11.5-15.5 Final  . Platelets (K/uL) 11/03/2011 271  150-400 Final  . Sodium (mEq/L) 11/03/2011 133* 135-145 Final  . Potassium (mEq/L) 11/03/2011 4.3  3.5-5.1 Final   Comment: NO VISIBLE HEMOLYSIS                          DELTA CHECK NOTED  . Chloride (mEq/L) 11/03/2011 96  96-112 Final  . CO2 (mEq/L) 11/03/2011 26  19-32 Final  . Glucose, Bld (mg/dL) 37/16/9678 938* 10-17 Final  . BUN (mg/dL) 51/08/5850 8  7-78 Final  . Creatinine, Ser (mg/dL) 24/23/5361 4.43  1.54-0.08 Final  . Calcium (mg/dL) 67/61/9509 9.0  3.2-67.1 Final  . GFR calc non Af Amer (mL/min) 11/03/2011 >90  >90 Final  . GFR calc Af Amer (mL/min) 11/03/2011 >90  >90 Final   Comment:  The eGFR has been calculated                          using the CKD EPI equation.                          This calculation has not been                          validated in all clinical                          situations.                          eGFR's persistently                          <90 mL/min signify                          possible Chronic Kidney Disease.  .  Glucose-Capillary (mg/dL) 16/04/9603 540* 98-11 Final  . Comment 1  11/03/2011 Notify RN   Final  . Glucose-Capillary (mg/dL) 91/47/8295 621* 30-86 Final  . Comment 1  11/03/2011 Notify RN   Final  . Glucose-Capillary (mg/dL) 57/84/6962 952* 84-13 Final  . Glucose-Capillary (mg/dL) 24/40/1027 253* 66-44 Final  . Comment 1  11/03/2011 Notify RN   Final  . Glucose-Capillary (mg/dL) 03/47/4259 563* 87-56 Final  . Comment 1  11/04/2011 Notify RN   Final  ]  Signed: Blanca Friend 11/04/2011, 10:55 AM   Time Spent on Discharge: 40 minutes

## 2011-11-04 NOTE — Progress Notes (Signed)
Subjective: Patient laying down when I come in, in no acute distress.  Reports spasms over neck and arms.  No other complaints.  Ready to go today, social work has multiple bed offers for her.   Objective: Vital signs in last 24 hours: Filed Vitals:   11/03/11 1334 11/03/11 1600 11/03/11 2100 11/04/11 0500  BP:  113/56 111/63 126/65  Pulse:  81 88 73  Temp:  97.9 F (36.6 C) 98.2 F (36.8 C) 98.1 F (36.7 C)  TempSrc:   Oral Oral  Resp:  18 16 16   Height:      Weight:      SpO2: 93% 93% 91% 92%   Weight change:   Intake/Output Summary (Last 24 hours) at 11/04/11 0844 Last data filed at 11/04/11 0500  Gross per 24 hour  Intake    720 ml  Output    800 ml  Net    -80 ml   Physical Exam:  General:  Vital signs reviewed and noted. Well-developed, well-nourished, in NAD; alert, appropriate and cooperative throughout examination.   Head:  Normocephalic, atraumatic   Neck:  neck collar in place.   Lungs:  Normal respiratory effort. Clear to auscultation BL without crackles or wheezes.   Heart:  RRR. S1 and S2 normal without gallop, murmur, or rubs.   Neurologic:  A&O X3, CN II - XII are grossly intact. Moving all 4 extremities well. Sensations intact to light touch.  R 5th toe wrapped in gauze. Associated hematoma over top of L foot improving.   Lab Results: Basic Metabolic Panel:  Lab 11/03/11 1610 11/02/11 0716  NA 133* 136  K 4.3 5.3*  CL 96 99  CO2 26 21  GLUCOSE 291* 366*  BUN 8 13  CREATININE 0.55 0.64  CALCIUM 9.0 9.0  MG -- --  PHOS -- --   Liver Function Tests:  Lab 11/01/11 0909  AST 17  ALT 6  ALKPHOS 93  BILITOT 0.5  PROT 6.4  ALBUMIN 3.4*   CBC:  Lab 11/03/11 0630 11/02/11 0716 11/01/11 0559  WBC 9.8 13.3* --  NEUTROABS -- 10.1* 10.7*  HGB 11.2* 12.3 --  HCT 33.1* 37.0 --  MCV 86.4 90.2 --  PLT 271 310 --   Cardiac Enzymes:  Lab 11/02/11 0054 11/01/11 1551 11/01/11 0910  CKTOTAL 43 44 54  CKMB 1.7 1.7 1.6  CKMBINDEX --  -- --  TROPONINI <0.30 <0.30 <0.30   CBG:  Lab 11/04/11 0724 11/03/11 2041 11/03/11 1639 11/03/11 1150 11/03/11 0726 11/02/11 2113  GLUCAP 234* 229* 172* 188* 283* 249*   Hemoglobin A1C:  Lab 11/01/11 0909  HGBA1C 8.2*   Urinalysis:  Lab 11/01/11 1335  COLORURINE YELLOW  LABSPEC 1.011  PHURINE 6.5  GLUCOSEU 500*  HGBUR NEGATIVE  BILIRUBINUR NEGATIVE  KETONESUR NEGATIVE  PROTEINUR NEGATIVE  UROBILINOGEN 1.0  NITRITE NEGATIVE  LEUKOCYTESUR SMALL*   Micro Results: Recent Results (from the past 240 hour(s))  URINE CULTURE     Status: Normal   Collection Time   11/01/11  1:35 PM      Component Value Range Status Comment   Specimen Description URINE, RANDOM   Final    Special Requests Normal   Final    Culture  Setup Time [id number]   Final    Colony Count NO GROWTH   Final    Culture NO GROWTH   Final    Report Status 11/02/2011 FINAL   Final    Studies/Results: Dg Cervical Spine 1 View  11/02/2011  *RADIOLOGY REPORT*  Clinical Data: Status post cervical fusion.  DG CERVICAL SPINE - 1 VIEW  Comparison: MR cervical spine 08/31/2011.  Findings: Since the MRI, the patient has undergone C5-7 ACDF. Vertebral body height and alignment are maintained.  Hardware is intact.  IMPRESSION: C5-7 ACDF.  No acute finding.  Original Report Authenticated By: Bernadene Bell. Maricela Curet, [name]   Medications: I have reviewed the patient's current medications. Scheduled Meds:   . docusate sodium  100 mg Oral BID  . DULoxetine  120 mg Oral Daily  . enoxaparin  40 mg Subcutaneous Q24H  . insulin aspart  0-9 Units Subcutaneous TID WC  . insulin aspart  5 Units Subcutaneous TID WC  . insulin glargine  24 Units Subcutaneous Daily  . omeprazole  20 mg Oral Q1200  . simvastatin  5 mg Oral q1800  . sodium chloride  3 mL Intravenous Q12H  . DISCONTD: insulin aspart  4 Units Subcutaneous TID WC  . DISCONTD: insulin glargine  22 Units Subcutaneous Daily   Continuous Infusions:   . DISCONTD: sodium  chloride 150 mL/hr at 11/02/11 0256   PRN Meds:.clonazePAM, ibuprofen, ondansetron (ZOFRAN) IV, ondansetron, oxyCODONE-acetaminophen    Assessment/Plan:  This is a 56 year old woman with history of recent cervical spine surgery who presents for multiple un witnessed syncopal episodes.   Syncope - Questionable syncopal episodes as described by patient. Differential includes medication related as patient was taking anti-anxiety and pain medication on a scheduled basis with minimal oral intake vs autonomic dysfunction given history of DM, orthostatic hypotension as patient was still taking anti-hypertensives despite poor oral intake vs arrhythmia vs vasovagal. Of note, CT head within normal limits. Orthostatic vitals done on admission are suspicious for error recording (57 pulse supine same as diastolic BP and out of line with other pulses here), not orthostatic yesterday morning after IVF. Unsure if she was orthostatic on admission. IVFs discontinued. Echo showed normal EF, left atrium upper limits of normal size. Seems that medications are most likely cause of her episodes at home ("took between 2-4 Klonopins, a Percocet, and Cymbalta" all at once).   Improvement in her subjective mental status since admission also makes medications most likely etiology of her falls as we have gotten her off IV narcotics on to a lower dose of Percocet for pain control.   Plan:  -discharge to SNF today for PT/OT/rehab/med management while she recovers from her surgery from last admission by neurosurgery team  History of Recent Surgical Spine Surgical, subsequent falls:  -Can shower whenever she wants, keep on collar per Dr. Lovell Sheehan -Will attempt to contact Dr. Lovell Sheehan for any further recs before discharge -Continue percocet and ibuprofen for pain control on discharge -Her perception of her pain is possibly influenced by her mood, will continue cymbalta   DM Type 1 - Lantus and meal time novolog plus sliding  scale while here since she was not cognating consistently well enough to use her insulin pump. Will get in touch with diabetes coordinator to restart insulin pump before discharge to SNF today.  HTN - BPs good.  Will restart on discharge for renal protection given her DM.    Fibromyalgia - Percocet, ibuprofen, cymbalta      LOS: 3 days   Yaakov Guthrie, BRAD 11/04/2011, 8:44 AM

## 2011-11-12 DIAGNOSIS — F410 Panic disorder [episodic paroxysmal anxiety] without agoraphobia: Secondary | ICD-10-CM | POA: Diagnosis not present

## 2011-11-12 DIAGNOSIS — F3132 Bipolar disorder, current episode depressed, moderate: Secondary | ICD-10-CM | POA: Diagnosis not present

## 2011-11-12 DIAGNOSIS — F4310 Post-traumatic stress disorder, unspecified: Secondary | ICD-10-CM | POA: Diagnosis not present

## 2011-11-17 DIAGNOSIS — E789 Disorder of lipoprotein metabolism, unspecified: Secondary | ICD-10-CM | POA: Diagnosis not present

## 2011-11-17 DIAGNOSIS — E119 Type 2 diabetes mellitus without complications: Secondary | ICD-10-CM | POA: Diagnosis not present

## 2011-11-19 DIAGNOSIS — F4310 Post-traumatic stress disorder, unspecified: Secondary | ICD-10-CM | POA: Diagnosis not present

## 2011-11-19 DIAGNOSIS — M4802 Spinal stenosis, cervical region: Secondary | ICD-10-CM | POA: Diagnosis not present

## 2011-11-19 DIAGNOSIS — F3132 Bipolar disorder, current episode depressed, moderate: Secondary | ICD-10-CM | POA: Diagnosis not present

## 2011-11-19 DIAGNOSIS — E789 Disorder of lipoprotein metabolism, unspecified: Secondary | ICD-10-CM | POA: Diagnosis not present

## 2011-11-19 DIAGNOSIS — F410 Panic disorder [episodic paroxysmal anxiety] without agoraphobia: Secondary | ICD-10-CM | POA: Diagnosis not present

## 2011-11-22 DIAGNOSIS — E789 Disorder of lipoprotein metabolism, unspecified: Secondary | ICD-10-CM | POA: Diagnosis not present

## 2011-11-22 DIAGNOSIS — M542 Cervicalgia: Secondary | ICD-10-CM | POA: Diagnosis not present

## 2011-11-22 DIAGNOSIS — E2839 Other primary ovarian failure: Secondary | ICD-10-CM | POA: Diagnosis not present

## 2011-11-22 DIAGNOSIS — E119 Type 2 diabetes mellitus without complications: Secondary | ICD-10-CM | POA: Diagnosis not present

## 2011-11-22 DIAGNOSIS — I10 Essential (primary) hypertension: Secondary | ICD-10-CM | POA: Diagnosis not present

## 2011-11-26 DIAGNOSIS — F410 Panic disorder [episodic paroxysmal anxiety] without agoraphobia: Secondary | ICD-10-CM | POA: Diagnosis not present

## 2011-11-26 DIAGNOSIS — F3131 Bipolar disorder, current episode depressed, mild: Secondary | ICD-10-CM | POA: Diagnosis not present

## 2011-11-26 DIAGNOSIS — F4310 Post-traumatic stress disorder, unspecified: Secondary | ICD-10-CM | POA: Diagnosis not present

## 2011-11-30 DIAGNOSIS — B351 Tinea unguium: Secondary | ICD-10-CM | POA: Diagnosis not present

## 2011-12-03 DIAGNOSIS — F410 Panic disorder [episodic paroxysmal anxiety] without agoraphobia: Secondary | ICD-10-CM | POA: Diagnosis not present

## 2011-12-03 DIAGNOSIS — F3131 Bipolar disorder, current episode depressed, mild: Secondary | ICD-10-CM | POA: Diagnosis not present

## 2011-12-03 DIAGNOSIS — F4310 Post-traumatic stress disorder, unspecified: Secondary | ICD-10-CM | POA: Diagnosis not present

## 2011-12-24 DIAGNOSIS — F4310 Post-traumatic stress disorder, unspecified: Secondary | ICD-10-CM | POA: Diagnosis not present

## 2011-12-24 DIAGNOSIS — F410 Panic disorder [episodic paroxysmal anxiety] without agoraphobia: Secondary | ICD-10-CM | POA: Diagnosis not present

## 2011-12-24 DIAGNOSIS — F3131 Bipolar disorder, current episode depressed, mild: Secondary | ICD-10-CM | POA: Diagnosis not present

## 2011-12-31 DIAGNOSIS — F4310 Post-traumatic stress disorder, unspecified: Secondary | ICD-10-CM | POA: Diagnosis not present

## 2011-12-31 DIAGNOSIS — F3131 Bipolar disorder, current episode depressed, mild: Secondary | ICD-10-CM | POA: Diagnosis not present

## 2011-12-31 DIAGNOSIS — F410 Panic disorder [episodic paroxysmal anxiety] without agoraphobia: Secondary | ICD-10-CM | POA: Diagnosis not present

## 2012-01-07 DIAGNOSIS — F4310 Post-traumatic stress disorder, unspecified: Secondary | ICD-10-CM | POA: Diagnosis not present

## 2012-01-07 DIAGNOSIS — F3132 Bipolar disorder, current episode depressed, moderate: Secondary | ICD-10-CM | POA: Diagnosis not present

## 2012-01-07 DIAGNOSIS — F410 Panic disorder [episodic paroxysmal anxiety] without agoraphobia: Secondary | ICD-10-CM | POA: Diagnosis not present

## 2012-01-11 DIAGNOSIS — F3132 Bipolar disorder, current episode depressed, moderate: Secondary | ICD-10-CM | POA: Diagnosis not present

## 2012-01-11 DIAGNOSIS — F4310 Post-traumatic stress disorder, unspecified: Secondary | ICD-10-CM | POA: Diagnosis not present

## 2012-01-11 DIAGNOSIS — F410 Panic disorder [episodic paroxysmal anxiety] without agoraphobia: Secondary | ICD-10-CM | POA: Diagnosis not present

## 2012-01-14 DIAGNOSIS — F3132 Bipolar disorder, current episode depressed, moderate: Secondary | ICD-10-CM | POA: Diagnosis not present

## 2012-01-14 DIAGNOSIS — F410 Panic disorder [episodic paroxysmal anxiety] without agoraphobia: Secondary | ICD-10-CM | POA: Diagnosis not present

## 2012-01-14 DIAGNOSIS — F4310 Post-traumatic stress disorder, unspecified: Secondary | ICD-10-CM | POA: Diagnosis not present

## 2012-01-18 DIAGNOSIS — E109 Type 1 diabetes mellitus without complications: Secondary | ICD-10-CM | POA: Diagnosis not present

## 2012-01-18 DIAGNOSIS — G609 Hereditary and idiopathic neuropathy, unspecified: Secondary | ICD-10-CM | POA: Diagnosis not present

## 2012-01-18 DIAGNOSIS — E7800 Pure hypercholesterolemia, unspecified: Secondary | ICD-10-CM | POA: Diagnosis not present

## 2012-01-18 DIAGNOSIS — I10 Essential (primary) hypertension: Secondary | ICD-10-CM | POA: Diagnosis not present

## 2012-01-28 DIAGNOSIS — F410 Panic disorder [episodic paroxysmal anxiety] without agoraphobia: Secondary | ICD-10-CM | POA: Diagnosis not present

## 2012-01-28 DIAGNOSIS — F3132 Bipolar disorder, current episode depressed, moderate: Secondary | ICD-10-CM | POA: Diagnosis not present

## 2012-01-28 DIAGNOSIS — F4310 Post-traumatic stress disorder, unspecified: Secondary | ICD-10-CM | POA: Diagnosis not present

## 2012-02-04 DIAGNOSIS — F4310 Post-traumatic stress disorder, unspecified: Secondary | ICD-10-CM | POA: Diagnosis not present

## 2012-02-04 DIAGNOSIS — F410 Panic disorder [episodic paroxysmal anxiety] without agoraphobia: Secondary | ICD-10-CM | POA: Diagnosis not present

## 2012-02-04 DIAGNOSIS — F3132 Bipolar disorder, current episode depressed, moderate: Secondary | ICD-10-CM | POA: Diagnosis not present

## 2012-02-18 DIAGNOSIS — F3132 Bipolar disorder, current episode depressed, moderate: Secondary | ICD-10-CM | POA: Diagnosis not present

## 2012-02-18 DIAGNOSIS — F4310 Post-traumatic stress disorder, unspecified: Secondary | ICD-10-CM | POA: Diagnosis not present

## 2012-02-18 DIAGNOSIS — F410 Panic disorder [episodic paroxysmal anxiety] without agoraphobia: Secondary | ICD-10-CM | POA: Diagnosis not present

## 2012-02-28 DIAGNOSIS — E119 Type 2 diabetes mellitus without complications: Secondary | ICD-10-CM | POA: Diagnosis not present

## 2012-03-03 DIAGNOSIS — F3131 Bipolar disorder, current episode depressed, mild: Secondary | ICD-10-CM | POA: Diagnosis not present

## 2012-03-03 DIAGNOSIS — F4310 Post-traumatic stress disorder, unspecified: Secondary | ICD-10-CM | POA: Diagnosis not present

## 2012-03-03 DIAGNOSIS — F410 Panic disorder [episodic paroxysmal anxiety] without agoraphobia: Secondary | ICD-10-CM | POA: Diagnosis not present

## 2012-03-09 DIAGNOSIS — E789 Disorder of lipoprotein metabolism, unspecified: Secondary | ICD-10-CM | POA: Diagnosis not present

## 2012-03-09 DIAGNOSIS — L909 Atrophic disorder of skin, unspecified: Secondary | ICD-10-CM | POA: Diagnosis not present

## 2012-03-09 DIAGNOSIS — E119 Type 2 diabetes mellitus without complications: Secondary | ICD-10-CM | POA: Diagnosis not present

## 2012-03-09 DIAGNOSIS — I10 Essential (primary) hypertension: Secondary | ICD-10-CM | POA: Diagnosis not present

## 2012-03-09 DIAGNOSIS — L919 Hypertrophic disorder of the skin, unspecified: Secondary | ICD-10-CM | POA: Diagnosis not present

## 2012-03-24 DIAGNOSIS — F3131 Bipolar disorder, current episode depressed, mild: Secondary | ICD-10-CM | POA: Diagnosis not present

## 2012-03-24 DIAGNOSIS — F410 Panic disorder [episodic paroxysmal anxiety] without agoraphobia: Secondary | ICD-10-CM | POA: Diagnosis not present

## 2012-03-24 DIAGNOSIS — F4310 Post-traumatic stress disorder, unspecified: Secondary | ICD-10-CM | POA: Diagnosis not present

## 2012-03-31 DIAGNOSIS — F3132 Bipolar disorder, current episode depressed, moderate: Secondary | ICD-10-CM | POA: Diagnosis not present

## 2012-03-31 DIAGNOSIS — F4310 Post-traumatic stress disorder, unspecified: Secondary | ICD-10-CM | POA: Diagnosis not present

## 2012-03-31 DIAGNOSIS — F410 Panic disorder [episodic paroxysmal anxiety] without agoraphobia: Secondary | ICD-10-CM | POA: Diagnosis not present

## 2012-04-14 DIAGNOSIS — F4310 Post-traumatic stress disorder, unspecified: Secondary | ICD-10-CM | POA: Diagnosis not present

## 2012-04-14 DIAGNOSIS — F410 Panic disorder [episodic paroxysmal anxiety] without agoraphobia: Secondary | ICD-10-CM | POA: Diagnosis not present

## 2012-04-14 DIAGNOSIS — F3132 Bipolar disorder, current episode depressed, moderate: Secondary | ICD-10-CM | POA: Diagnosis not present

## 2012-04-18 DIAGNOSIS — F4310 Post-traumatic stress disorder, unspecified: Secondary | ICD-10-CM | POA: Diagnosis not present

## 2012-04-18 DIAGNOSIS — F3132 Bipolar disorder, current episode depressed, moderate: Secondary | ICD-10-CM | POA: Diagnosis not present

## 2012-04-18 DIAGNOSIS — F410 Panic disorder [episodic paroxysmal anxiety] without agoraphobia: Secondary | ICD-10-CM | POA: Diagnosis not present

## 2012-04-24 DIAGNOSIS — R197 Diarrhea, unspecified: Secondary | ICD-10-CM | POA: Diagnosis not present

## 2012-04-24 DIAGNOSIS — K589 Irritable bowel syndrome without diarrhea: Secondary | ICD-10-CM | POA: Diagnosis not present

## 2012-04-24 DIAGNOSIS — Z1211 Encounter for screening for malignant neoplasm of colon: Secondary | ICD-10-CM | POA: Diagnosis not present

## 2012-04-28 DIAGNOSIS — F4310 Post-traumatic stress disorder, unspecified: Secondary | ICD-10-CM | POA: Diagnosis not present

## 2012-04-28 DIAGNOSIS — F3132 Bipolar disorder, current episode depressed, moderate: Secondary | ICD-10-CM | POA: Diagnosis not present

## 2012-04-28 DIAGNOSIS — F410 Panic disorder [episodic paroxysmal anxiety] without agoraphobia: Secondary | ICD-10-CM | POA: Diagnosis not present

## 2012-05-02 DIAGNOSIS — E109 Type 1 diabetes mellitus without complications: Secondary | ICD-10-CM | POA: Diagnosis not present

## 2012-05-02 DIAGNOSIS — E789 Disorder of lipoprotein metabolism, unspecified: Secondary | ICD-10-CM | POA: Diagnosis not present

## 2012-05-02 DIAGNOSIS — E119 Type 2 diabetes mellitus without complications: Secondary | ICD-10-CM | POA: Diagnosis not present

## 2012-05-02 DIAGNOSIS — Z23 Encounter for immunization: Secondary | ICD-10-CM | POA: Diagnosis not present

## 2012-05-05 DIAGNOSIS — F3132 Bipolar disorder, current episode depressed, moderate: Secondary | ICD-10-CM | POA: Diagnosis not present

## 2012-05-05 DIAGNOSIS — F410 Panic disorder [episodic paroxysmal anxiety] without agoraphobia: Secondary | ICD-10-CM | POA: Diagnosis not present

## 2012-05-05 DIAGNOSIS — F4310 Post-traumatic stress disorder, unspecified: Secondary | ICD-10-CM | POA: Diagnosis not present

## 2012-05-12 DIAGNOSIS — F3132 Bipolar disorder, current episode depressed, moderate: Secondary | ICD-10-CM | POA: Diagnosis not present

## 2012-05-12 DIAGNOSIS — F410 Panic disorder [episodic paroxysmal anxiety] without agoraphobia: Secondary | ICD-10-CM | POA: Diagnosis not present

## 2012-05-12 DIAGNOSIS — F4310 Post-traumatic stress disorder, unspecified: Secondary | ICD-10-CM | POA: Diagnosis not present

## 2012-05-15 DIAGNOSIS — M810 Age-related osteoporosis without current pathological fracture: Secondary | ICD-10-CM | POA: Diagnosis not present

## 2012-05-15 DIAGNOSIS — I10 Essential (primary) hypertension: Secondary | ICD-10-CM | POA: Diagnosis not present

## 2012-05-15 DIAGNOSIS — E119 Type 2 diabetes mellitus without complications: Secondary | ICD-10-CM | POA: Diagnosis not present

## 2012-05-15 DIAGNOSIS — E789 Disorder of lipoprotein metabolism, unspecified: Secondary | ICD-10-CM | POA: Diagnosis not present

## 2012-05-19 DIAGNOSIS — F3132 Bipolar disorder, current episode depressed, moderate: Secondary | ICD-10-CM | POA: Diagnosis not present

## 2012-05-19 DIAGNOSIS — F4310 Post-traumatic stress disorder, unspecified: Secondary | ICD-10-CM | POA: Diagnosis not present

## 2012-05-19 DIAGNOSIS — F410 Panic disorder [episodic paroxysmal anxiety] without agoraphobia: Secondary | ICD-10-CM | POA: Diagnosis not present

## 2012-05-19 DIAGNOSIS — M720 Palmar fascial fibromatosis [Dupuytren]: Secondary | ICD-10-CM | POA: Diagnosis not present

## 2012-05-26 DIAGNOSIS — F410 Panic disorder [episodic paroxysmal anxiety] without agoraphobia: Secondary | ICD-10-CM | POA: Diagnosis not present

## 2012-05-26 DIAGNOSIS — F4310 Post-traumatic stress disorder, unspecified: Secondary | ICD-10-CM | POA: Diagnosis not present

## 2012-05-26 DIAGNOSIS — F3132 Bipolar disorder, current episode depressed, moderate: Secondary | ICD-10-CM | POA: Diagnosis not present

## 2012-06-02 DIAGNOSIS — F410 Panic disorder [episodic paroxysmal anxiety] without agoraphobia: Secondary | ICD-10-CM | POA: Diagnosis not present

## 2012-06-02 DIAGNOSIS — F4310 Post-traumatic stress disorder, unspecified: Secondary | ICD-10-CM | POA: Diagnosis not present

## 2012-06-02 DIAGNOSIS — F3132 Bipolar disorder, current episode depressed, moderate: Secondary | ICD-10-CM | POA: Diagnosis not present

## 2012-07-04 DIAGNOSIS — F410 Panic disorder [episodic paroxysmal anxiety] without agoraphobia: Secondary | ICD-10-CM | POA: Diagnosis not present

## 2012-07-04 DIAGNOSIS — F3175 Bipolar disorder, in partial remission, most recent episode depressed: Secondary | ICD-10-CM | POA: Diagnosis not present

## 2012-07-07 DIAGNOSIS — F410 Panic disorder [episodic paroxysmal anxiety] without agoraphobia: Secondary | ICD-10-CM | POA: Diagnosis not present

## 2012-07-07 DIAGNOSIS — F3131 Bipolar disorder, current episode depressed, mild: Secondary | ICD-10-CM | POA: Diagnosis not present

## 2012-07-14 DIAGNOSIS — F3131 Bipolar disorder, current episode depressed, mild: Secondary | ICD-10-CM | POA: Diagnosis not present

## 2012-07-14 DIAGNOSIS — F410 Panic disorder [episodic paroxysmal anxiety] without agoraphobia: Secondary | ICD-10-CM | POA: Diagnosis not present

## 2012-07-21 DIAGNOSIS — F3131 Bipolar disorder, current episode depressed, mild: Secondary | ICD-10-CM | POA: Diagnosis not present

## 2012-07-21 DIAGNOSIS — F410 Panic disorder [episodic paroxysmal anxiety] without agoraphobia: Secondary | ICD-10-CM | POA: Diagnosis not present

## 2012-07-27 DIAGNOSIS — L821 Other seborrheic keratosis: Secondary | ICD-10-CM | POA: Diagnosis not present

## 2012-07-27 DIAGNOSIS — L259 Unspecified contact dermatitis, unspecified cause: Secondary | ICD-10-CM | POA: Diagnosis not present

## 2012-07-27 DIAGNOSIS — L570 Actinic keratosis: Secondary | ICD-10-CM | POA: Diagnosis not present

## 2012-07-27 DIAGNOSIS — L930 Discoid lupus erythematosus: Secondary | ICD-10-CM | POA: Diagnosis not present

## 2012-07-28 DIAGNOSIS — F3131 Bipolar disorder, current episode depressed, mild: Secondary | ICD-10-CM | POA: Diagnosis not present

## 2012-07-28 DIAGNOSIS — F410 Panic disorder [episodic paroxysmal anxiety] without agoraphobia: Secondary | ICD-10-CM | POA: Diagnosis not present

## 2012-08-03 DIAGNOSIS — M4802 Spinal stenosis, cervical region: Secondary | ICD-10-CM | POA: Diagnosis not present

## 2012-08-04 DIAGNOSIS — F410 Panic disorder [episodic paroxysmal anxiety] without agoraphobia: Secondary | ICD-10-CM | POA: Diagnosis not present

## 2012-08-04 DIAGNOSIS — F3131 Bipolar disorder, current episode depressed, mild: Secondary | ICD-10-CM | POA: Diagnosis not present

## 2012-08-08 DIAGNOSIS — E119 Type 2 diabetes mellitus without complications: Secondary | ICD-10-CM | POA: Diagnosis not present

## 2012-08-11 DIAGNOSIS — F410 Panic disorder [episodic paroxysmal anxiety] without agoraphobia: Secondary | ICD-10-CM | POA: Diagnosis not present

## 2012-08-11 DIAGNOSIS — F3131 Bipolar disorder, current episode depressed, mild: Secondary | ICD-10-CM | POA: Diagnosis not present

## 2012-08-14 DIAGNOSIS — E109 Type 1 diabetes mellitus without complications: Secondary | ICD-10-CM | POA: Diagnosis not present

## 2012-08-14 DIAGNOSIS — I10 Essential (primary) hypertension: Secondary | ICD-10-CM | POA: Diagnosis not present

## 2012-08-15 DIAGNOSIS — E789 Disorder of lipoprotein metabolism, unspecified: Secondary | ICD-10-CM | POA: Diagnosis not present

## 2012-08-15 DIAGNOSIS — L930 Discoid lupus erythematosus: Secondary | ICD-10-CM | POA: Diagnosis not present

## 2012-08-15 DIAGNOSIS — E119 Type 2 diabetes mellitus without complications: Secondary | ICD-10-CM | POA: Diagnosis not present

## 2012-08-16 ENCOUNTER — Ambulatory Visit: Payer: Medicare Other | Attending: Neurosurgery | Admitting: Physical Therapy

## 2012-08-16 DIAGNOSIS — IMO0001 Reserved for inherently not codable concepts without codable children: Secondary | ICD-10-CM | POA: Diagnosis not present

## 2012-08-16 DIAGNOSIS — M545 Low back pain, unspecified: Secondary | ICD-10-CM | POA: Diagnosis not present

## 2012-08-16 DIAGNOSIS — M4040 Postural lordosis, site unspecified: Secondary | ICD-10-CM | POA: Insufficient documentation

## 2012-08-18 DIAGNOSIS — F410 Panic disorder [episodic paroxysmal anxiety] without agoraphobia: Secondary | ICD-10-CM | POA: Diagnosis not present

## 2012-08-18 DIAGNOSIS — F3131 Bipolar disorder, current episode depressed, mild: Secondary | ICD-10-CM | POA: Diagnosis not present

## 2012-08-21 ENCOUNTER — Ambulatory Visit: Payer: Medicare Other | Attending: Neurosurgery | Admitting: Physical Therapy

## 2012-08-21 DIAGNOSIS — IMO0001 Reserved for inherently not codable concepts without codable children: Secondary | ICD-10-CM | POA: Insufficient documentation

## 2012-08-21 DIAGNOSIS — M545 Low back pain, unspecified: Secondary | ICD-10-CM | POA: Insufficient documentation

## 2012-08-21 DIAGNOSIS — M4040 Postural lordosis, site unspecified: Secondary | ICD-10-CM | POA: Insufficient documentation

## 2012-08-24 ENCOUNTER — Ambulatory Visit: Payer: Medicare Other | Admitting: Physical Therapy

## 2012-08-24 DIAGNOSIS — M4040 Postural lordosis, site unspecified: Secondary | ICD-10-CM | POA: Diagnosis not present

## 2012-08-24 DIAGNOSIS — M545 Low back pain, unspecified: Secondary | ICD-10-CM | POA: Diagnosis not present

## 2012-08-24 DIAGNOSIS — IMO0001 Reserved for inherently not codable concepts without codable children: Secondary | ICD-10-CM | POA: Diagnosis not present

## 2012-08-25 DIAGNOSIS — F3131 Bipolar disorder, current episode depressed, mild: Secondary | ICD-10-CM | POA: Diagnosis not present

## 2012-08-25 DIAGNOSIS — F410 Panic disorder [episodic paroxysmal anxiety] without agoraphobia: Secondary | ICD-10-CM | POA: Diagnosis not present

## 2012-08-28 ENCOUNTER — Ambulatory Visit: Payer: Medicare Other | Admitting: Physical Therapy

## 2012-08-28 DIAGNOSIS — M545 Low back pain, unspecified: Secondary | ICD-10-CM | POA: Diagnosis not present

## 2012-08-28 DIAGNOSIS — E109 Type 1 diabetes mellitus without complications: Secondary | ICD-10-CM | POA: Diagnosis not present

## 2012-08-28 DIAGNOSIS — IMO0001 Reserved for inherently not codable concepts without codable children: Secondary | ICD-10-CM | POA: Diagnosis not present

## 2012-08-28 DIAGNOSIS — M4040 Postural lordosis, site unspecified: Secondary | ICD-10-CM | POA: Diagnosis not present

## 2012-08-30 ENCOUNTER — Encounter: Payer: Medicare Other | Admitting: Rehabilitation

## 2012-08-30 ENCOUNTER — Ambulatory Visit: Payer: Medicare Other | Admitting: Physical Therapy

## 2012-09-01 ENCOUNTER — Encounter: Payer: Medicare Other | Admitting: Physical Therapy

## 2012-09-04 ENCOUNTER — Encounter: Payer: Medicare Other | Admitting: Physical Therapy

## 2012-09-04 ENCOUNTER — Ambulatory Visit: Payer: Medicare Other | Admitting: Physical Therapy

## 2012-09-04 DIAGNOSIS — IMO0001 Reserved for inherently not codable concepts without codable children: Secondary | ICD-10-CM | POA: Diagnosis not present

## 2012-09-04 DIAGNOSIS — L0390 Cellulitis, unspecified: Secondary | ICD-10-CM | POA: Diagnosis not present

## 2012-09-04 DIAGNOSIS — M545 Low back pain, unspecified: Secondary | ICD-10-CM | POA: Diagnosis not present

## 2012-09-04 DIAGNOSIS — L0291 Cutaneous abscess, unspecified: Secondary | ICD-10-CM | POA: Diagnosis not present

## 2012-09-04 DIAGNOSIS — M4040 Postural lordosis, site unspecified: Secondary | ICD-10-CM | POA: Diagnosis not present

## 2012-09-05 ENCOUNTER — Ambulatory Visit: Payer: Medicare Other | Admitting: Physical Therapy

## 2012-09-05 DIAGNOSIS — M4040 Postural lordosis, site unspecified: Secondary | ICD-10-CM | POA: Diagnosis not present

## 2012-09-05 DIAGNOSIS — L0291 Cutaneous abscess, unspecified: Secondary | ICD-10-CM | POA: Diagnosis not present

## 2012-09-05 DIAGNOSIS — IMO0001 Reserved for inherently not codable concepts without codable children: Secondary | ICD-10-CM | POA: Diagnosis not present

## 2012-09-05 DIAGNOSIS — M545 Low back pain, unspecified: Secondary | ICD-10-CM | POA: Diagnosis not present

## 2012-09-06 ENCOUNTER — Encounter: Payer: Medicare Other | Admitting: Rehabilitation

## 2012-09-07 DIAGNOSIS — Z1211 Encounter for screening for malignant neoplasm of colon: Secondary | ICD-10-CM | POA: Diagnosis not present

## 2012-09-08 ENCOUNTER — Encounter: Payer: Medicare Other | Admitting: Rehabilitation

## 2012-09-08 DIAGNOSIS — F410 Panic disorder [episodic paroxysmal anxiety] without agoraphobia: Secondary | ICD-10-CM | POA: Diagnosis not present

## 2012-09-08 DIAGNOSIS — F3131 Bipolar disorder, current episode depressed, mild: Secondary | ICD-10-CM | POA: Diagnosis not present

## 2012-09-11 DIAGNOSIS — M545 Low back pain, unspecified: Secondary | ICD-10-CM | POA: Diagnosis not present

## 2012-09-11 DIAGNOSIS — M48061 Spinal stenosis, lumbar region without neurogenic claudication: Secondary | ICD-10-CM | POA: Diagnosis not present

## 2012-09-12 ENCOUNTER — Ambulatory Visit: Payer: Medicare Other | Admitting: Physical Therapy

## 2012-09-12 DIAGNOSIS — M5137 Other intervertebral disc degeneration, lumbosacral region: Secondary | ICD-10-CM | POA: Diagnosis not present

## 2012-09-12 DIAGNOSIS — IMO0001 Reserved for inherently not codable concepts without codable children: Secondary | ICD-10-CM | POA: Diagnosis not present

## 2012-09-12 DIAGNOSIS — IMO0002 Reserved for concepts with insufficient information to code with codable children: Secondary | ICD-10-CM | POA: Diagnosis not present

## 2012-09-12 DIAGNOSIS — M545 Low back pain, unspecified: Secondary | ICD-10-CM | POA: Diagnosis not present

## 2012-09-12 DIAGNOSIS — M4040 Postural lordosis, site unspecified: Secondary | ICD-10-CM | POA: Diagnosis not present

## 2012-09-14 ENCOUNTER — Ambulatory Visit: Payer: Medicare Other | Admitting: Physical Therapy

## 2012-09-15 DIAGNOSIS — F410 Panic disorder [episodic paroxysmal anxiety] without agoraphobia: Secondary | ICD-10-CM | POA: Diagnosis not present

## 2012-09-15 DIAGNOSIS — F3131 Bipolar disorder, current episode depressed, mild: Secondary | ICD-10-CM | POA: Diagnosis not present

## 2012-09-18 ENCOUNTER — Ambulatory Visit: Payer: Medicare Other | Attending: Neurosurgery | Admitting: Physical Therapy

## 2012-09-18 DIAGNOSIS — M545 Low back pain, unspecified: Secondary | ICD-10-CM | POA: Diagnosis not present

## 2012-09-18 DIAGNOSIS — M4040 Postural lordosis, site unspecified: Secondary | ICD-10-CM | POA: Diagnosis not present

## 2012-09-18 DIAGNOSIS — IMO0001 Reserved for inherently not codable concepts without codable children: Secondary | ICD-10-CM | POA: Diagnosis not present

## 2012-09-20 ENCOUNTER — Ambulatory Visit: Payer: Medicare Other | Admitting: Physical Therapy

## 2012-09-21 ENCOUNTER — Encounter: Payer: Medicare Other | Admitting: Physical Therapy

## 2012-09-25 ENCOUNTER — Encounter: Payer: Medicare Other | Admitting: Physical Therapy

## 2012-09-28 ENCOUNTER — Encounter: Payer: Medicare Other | Admitting: Physical Therapy

## 2012-10-06 DIAGNOSIS — F3131 Bipolar disorder, current episode depressed, mild: Secondary | ICD-10-CM | POA: Diagnosis not present

## 2012-10-06 DIAGNOSIS — F410 Panic disorder [episodic paroxysmal anxiety] without agoraphobia: Secondary | ICD-10-CM | POA: Diagnosis not present

## 2012-10-09 ENCOUNTER — Ambulatory Visit: Payer: Medicare Other | Admitting: Physical Therapy

## 2012-10-09 DIAGNOSIS — IMO0002 Reserved for concepts with insufficient information to code with codable children: Secondary | ICD-10-CM | POA: Diagnosis not present

## 2012-10-09 DIAGNOSIS — M545 Low back pain, unspecified: Secondary | ICD-10-CM | POA: Diagnosis not present

## 2012-10-09 DIAGNOSIS — M5137 Other intervertebral disc degeneration, lumbosacral region: Secondary | ICD-10-CM | POA: Diagnosis not present

## 2012-10-12 ENCOUNTER — Ambulatory Visit: Payer: Medicare Other | Admitting: Physical Therapy

## 2012-10-13 DIAGNOSIS — F3131 Bipolar disorder, current episode depressed, mild: Secondary | ICD-10-CM | POA: Diagnosis not present

## 2012-10-13 DIAGNOSIS — F410 Panic disorder [episodic paroxysmal anxiety] without agoraphobia: Secondary | ICD-10-CM | POA: Diagnosis not present

## 2012-10-16 ENCOUNTER — Ambulatory Visit: Payer: Medicare Other | Admitting: Physical Therapy

## 2012-10-19 ENCOUNTER — Ambulatory Visit: Payer: Medicare Other | Attending: Neurosurgery | Admitting: Physical Therapy

## 2012-10-19 DIAGNOSIS — M4040 Postural lordosis, site unspecified: Secondary | ICD-10-CM | POA: Diagnosis not present

## 2012-10-19 DIAGNOSIS — IMO0001 Reserved for inherently not codable concepts without codable children: Secondary | ICD-10-CM | POA: Diagnosis not present

## 2012-10-19 DIAGNOSIS — M545 Low back pain, unspecified: Secondary | ICD-10-CM | POA: Diagnosis not present

## 2012-10-20 DIAGNOSIS — F410 Panic disorder [episodic paroxysmal anxiety] without agoraphobia: Secondary | ICD-10-CM | POA: Diagnosis not present

## 2012-10-20 DIAGNOSIS — F3131 Bipolar disorder, current episode depressed, mild: Secondary | ICD-10-CM | POA: Diagnosis not present

## 2012-10-23 ENCOUNTER — Ambulatory Visit: Payer: Medicare Other | Admitting: Physical Therapy

## 2012-10-26 ENCOUNTER — Ambulatory Visit: Payer: Medicare Other | Admitting: Physical Therapy

## 2012-10-27 DIAGNOSIS — F3131 Bipolar disorder, current episode depressed, mild: Secondary | ICD-10-CM | POA: Diagnosis not present

## 2012-10-27 DIAGNOSIS — F410 Panic disorder [episodic paroxysmal anxiety] without agoraphobia: Secondary | ICD-10-CM | POA: Diagnosis not present

## 2012-10-30 ENCOUNTER — Ambulatory Visit: Payer: Medicare Other | Admitting: Physical Therapy

## 2012-11-02 ENCOUNTER — Ambulatory Visit: Payer: Medicare Other | Admitting: Physical Therapy

## 2012-11-06 ENCOUNTER — Ambulatory Visit: Payer: Medicare Other | Admitting: Physical Therapy

## 2012-11-07 DIAGNOSIS — F3175 Bipolar disorder, in partial remission, most recent episode depressed: Secondary | ICD-10-CM | POA: Diagnosis not present

## 2012-11-08 DIAGNOSIS — M545 Low back pain, unspecified: Secondary | ICD-10-CM | POA: Diagnosis not present

## 2012-11-09 ENCOUNTER — Ambulatory Visit: Payer: Medicare Other | Admitting: Physical Therapy

## 2012-11-09 DIAGNOSIS — E119 Type 2 diabetes mellitus without complications: Secondary | ICD-10-CM | POA: Diagnosis not present

## 2012-11-09 DIAGNOSIS — E109 Type 1 diabetes mellitus without complications: Secondary | ICD-10-CM | POA: Diagnosis not present

## 2012-11-13 ENCOUNTER — Ambulatory Visit: Payer: Medicare Other | Admitting: Physical Therapy

## 2012-11-16 ENCOUNTER — Ambulatory Visit: Payer: Medicare Other | Attending: Neurosurgery | Admitting: Physical Therapy

## 2012-11-16 DIAGNOSIS — M545 Low back pain, unspecified: Secondary | ICD-10-CM | POA: Diagnosis not present

## 2012-11-16 DIAGNOSIS — M4040 Postural lordosis, site unspecified: Secondary | ICD-10-CM | POA: Insufficient documentation

## 2012-11-16 DIAGNOSIS — IMO0001 Reserved for inherently not codable concepts without codable children: Secondary | ICD-10-CM | POA: Insufficient documentation

## 2012-11-17 DIAGNOSIS — F410 Panic disorder [episodic paroxysmal anxiety] without agoraphobia: Secondary | ICD-10-CM | POA: Diagnosis not present

## 2012-11-17 DIAGNOSIS — F3175 Bipolar disorder, in partial remission, most recent episode depressed: Secondary | ICD-10-CM | POA: Diagnosis not present

## 2012-11-20 DIAGNOSIS — M48061 Spinal stenosis, lumbar region without neurogenic claudication: Secondary | ICD-10-CM | POA: Diagnosis not present

## 2012-11-20 DIAGNOSIS — M545 Low back pain, unspecified: Secondary | ICD-10-CM | POA: Diagnosis not present

## 2012-11-21 ENCOUNTER — Ambulatory Visit: Payer: Medicare Other | Admitting: Physical Therapy

## 2012-11-21 DIAGNOSIS — M545 Low back pain, unspecified: Secondary | ICD-10-CM | POA: Diagnosis not present

## 2012-11-21 DIAGNOSIS — M4040 Postural lordosis, site unspecified: Secondary | ICD-10-CM | POA: Diagnosis not present

## 2012-11-21 DIAGNOSIS — IMO0001 Reserved for inherently not codable concepts without codable children: Secondary | ICD-10-CM | POA: Diagnosis not present

## 2012-11-23 ENCOUNTER — Ambulatory Visit: Payer: Medicare Other | Admitting: Physical Therapy

## 2012-11-23 DIAGNOSIS — M545 Low back pain, unspecified: Secondary | ICD-10-CM | POA: Diagnosis not present

## 2012-11-23 DIAGNOSIS — IMO0001 Reserved for inherently not codable concepts without codable children: Secondary | ICD-10-CM | POA: Diagnosis not present

## 2012-11-23 DIAGNOSIS — M4040 Postural lordosis, site unspecified: Secondary | ICD-10-CM | POA: Diagnosis not present

## 2012-11-24 DIAGNOSIS — F410 Panic disorder [episodic paroxysmal anxiety] without agoraphobia: Secondary | ICD-10-CM | POA: Diagnosis not present

## 2012-11-24 DIAGNOSIS — F3175 Bipolar disorder, in partial remission, most recent episode depressed: Secondary | ICD-10-CM | POA: Diagnosis not present

## 2012-11-27 ENCOUNTER — Ambulatory Visit: Payer: Medicare Other | Admitting: Physical Therapy

## 2012-11-27 DIAGNOSIS — M545 Low back pain, unspecified: Secondary | ICD-10-CM | POA: Diagnosis not present

## 2012-11-27 DIAGNOSIS — IMO0001 Reserved for inherently not codable concepts without codable children: Secondary | ICD-10-CM | POA: Diagnosis not present

## 2012-11-27 DIAGNOSIS — M4040 Postural lordosis, site unspecified: Secondary | ICD-10-CM | POA: Diagnosis not present

## 2012-11-30 ENCOUNTER — Ambulatory Visit: Payer: Medicare Other | Admitting: Physical Therapy

## 2012-11-30 DIAGNOSIS — M545 Low back pain, unspecified: Secondary | ICD-10-CM | POA: Diagnosis not present

## 2012-11-30 DIAGNOSIS — IMO0001 Reserved for inherently not codable concepts without codable children: Secondary | ICD-10-CM | POA: Diagnosis not present

## 2012-11-30 DIAGNOSIS — M4040 Postural lordosis, site unspecified: Secondary | ICD-10-CM | POA: Diagnosis not present

## 2012-12-01 DIAGNOSIS — F3132 Bipolar disorder, current episode depressed, moderate: Secondary | ICD-10-CM | POA: Diagnosis not present

## 2012-12-04 ENCOUNTER — Ambulatory Visit: Payer: Medicare Other | Admitting: Physical Therapy

## 2012-12-04 DIAGNOSIS — M545 Low back pain, unspecified: Secondary | ICD-10-CM | POA: Diagnosis not present

## 2012-12-04 DIAGNOSIS — M4040 Postural lordosis, site unspecified: Secondary | ICD-10-CM | POA: Diagnosis not present

## 2012-12-04 DIAGNOSIS — IMO0001 Reserved for inherently not codable concepts without codable children: Secondary | ICD-10-CM | POA: Diagnosis not present

## 2012-12-07 ENCOUNTER — Ambulatory Visit: Payer: Medicare Other | Admitting: Physical Therapy

## 2012-12-08 DIAGNOSIS — F410 Panic disorder [episodic paroxysmal anxiety] without agoraphobia: Secondary | ICD-10-CM | POA: Diagnosis not present

## 2012-12-08 DIAGNOSIS — F3131 Bipolar disorder, current episode depressed, mild: Secondary | ICD-10-CM | POA: Diagnosis not present

## 2012-12-29 DIAGNOSIS — F410 Panic disorder [episodic paroxysmal anxiety] without agoraphobia: Secondary | ICD-10-CM | POA: Diagnosis not present

## 2012-12-29 DIAGNOSIS — F3131 Bipolar disorder, current episode depressed, mild: Secondary | ICD-10-CM | POA: Diagnosis not present

## 2013-01-12 DIAGNOSIS — F3131 Bipolar disorder, current episode depressed, mild: Secondary | ICD-10-CM | POA: Diagnosis not present

## 2013-01-12 DIAGNOSIS — F410 Panic disorder [episodic paroxysmal anxiety] without agoraphobia: Secondary | ICD-10-CM | POA: Diagnosis not present

## 2013-01-17 DIAGNOSIS — F410 Panic disorder [episodic paroxysmal anxiety] without agoraphobia: Secondary | ICD-10-CM | POA: Diagnosis not present

## 2013-01-17 DIAGNOSIS — F3131 Bipolar disorder, current episode depressed, mild: Secondary | ICD-10-CM | POA: Diagnosis not present

## 2013-02-16 DIAGNOSIS — F410 Panic disorder [episodic paroxysmal anxiety] without agoraphobia: Secondary | ICD-10-CM | POA: Diagnosis not present

## 2013-02-16 DIAGNOSIS — F3131 Bipolar disorder, current episode depressed, mild: Secondary | ICD-10-CM | POA: Diagnosis not present

## 2013-03-02 DIAGNOSIS — F3131 Bipolar disorder, current episode depressed, mild: Secondary | ICD-10-CM | POA: Diagnosis not present

## 2013-03-02 DIAGNOSIS — F410 Panic disorder [episodic paroxysmal anxiety] without agoraphobia: Secondary | ICD-10-CM | POA: Diagnosis not present

## 2013-03-07 DIAGNOSIS — F4310 Post-traumatic stress disorder, unspecified: Secondary | ICD-10-CM | POA: Diagnosis not present

## 2013-03-07 DIAGNOSIS — F314 Bipolar disorder, current episode depressed, severe, without psychotic features: Secondary | ICD-10-CM | POA: Diagnosis not present

## 2013-03-16 DIAGNOSIS — F314 Bipolar disorder, current episode depressed, severe, without psychotic features: Secondary | ICD-10-CM | POA: Diagnosis not present

## 2013-03-16 DIAGNOSIS — F4310 Post-traumatic stress disorder, unspecified: Secondary | ICD-10-CM | POA: Diagnosis not present

## 2013-03-22 DIAGNOSIS — F3132 Bipolar disorder, current episode depressed, moderate: Secondary | ICD-10-CM | POA: Diagnosis not present

## 2013-03-22 DIAGNOSIS — F4310 Post-traumatic stress disorder, unspecified: Secondary | ICD-10-CM | POA: Diagnosis not present

## 2013-03-29 DIAGNOSIS — E109 Type 1 diabetes mellitus without complications: Secondary | ICD-10-CM | POA: Diagnosis not present

## 2013-03-30 DIAGNOSIS — F3132 Bipolar disorder, current episode depressed, moderate: Secondary | ICD-10-CM | POA: Diagnosis not present

## 2013-04-06 DIAGNOSIS — F4310 Post-traumatic stress disorder, unspecified: Secondary | ICD-10-CM | POA: Diagnosis not present

## 2013-04-06 DIAGNOSIS — F3132 Bipolar disorder, current episode depressed, moderate: Secondary | ICD-10-CM | POA: Diagnosis not present

## 2013-04-13 DIAGNOSIS — F3132 Bipolar disorder, current episode depressed, moderate: Secondary | ICD-10-CM | POA: Diagnosis not present

## 2013-04-13 DIAGNOSIS — F4310 Post-traumatic stress disorder, unspecified: Secondary | ICD-10-CM | POA: Diagnosis not present

## 2013-04-20 DIAGNOSIS — F3131 Bipolar disorder, current episode depressed, mild: Secondary | ICD-10-CM | POA: Diagnosis not present

## 2013-04-20 DIAGNOSIS — F4310 Post-traumatic stress disorder, unspecified: Secondary | ICD-10-CM | POA: Diagnosis not present

## 2013-04-23 DIAGNOSIS — F3131 Bipolar disorder, current episode depressed, mild: Secondary | ICD-10-CM | POA: Diagnosis not present

## 2013-04-23 DIAGNOSIS — F4310 Post-traumatic stress disorder, unspecified: Secondary | ICD-10-CM | POA: Diagnosis not present

## 2013-04-27 DIAGNOSIS — F4310 Post-traumatic stress disorder, unspecified: Secondary | ICD-10-CM | POA: Diagnosis not present

## 2013-04-27 DIAGNOSIS — F3131 Bipolar disorder, current episode depressed, mild: Secondary | ICD-10-CM | POA: Diagnosis not present

## 2013-05-04 DIAGNOSIS — F4310 Post-traumatic stress disorder, unspecified: Secondary | ICD-10-CM | POA: Diagnosis not present

## 2013-05-04 DIAGNOSIS — F3175 Bipolar disorder, in partial remission, most recent episode depressed: Secondary | ICD-10-CM | POA: Diagnosis not present

## 2013-05-11 DIAGNOSIS — F3175 Bipolar disorder, in partial remission, most recent episode depressed: Secondary | ICD-10-CM | POA: Diagnosis not present

## 2013-05-11 DIAGNOSIS — F4310 Post-traumatic stress disorder, unspecified: Secondary | ICD-10-CM | POA: Diagnosis not present

## 2013-05-18 DIAGNOSIS — F4310 Post-traumatic stress disorder, unspecified: Secondary | ICD-10-CM | POA: Diagnosis not present

## 2013-05-18 DIAGNOSIS — F3175 Bipolar disorder, in partial remission, most recent episode depressed: Secondary | ICD-10-CM | POA: Diagnosis not present

## 2013-06-01 DIAGNOSIS — F4310 Post-traumatic stress disorder, unspecified: Secondary | ICD-10-CM | POA: Diagnosis not present

## 2013-06-01 DIAGNOSIS — F3175 Bipolar disorder, in partial remission, most recent episode depressed: Secondary | ICD-10-CM | POA: Diagnosis not present

## 2013-06-08 DIAGNOSIS — F3131 Bipolar disorder, current episode depressed, mild: Secondary | ICD-10-CM | POA: Diagnosis not present

## 2013-06-08 DIAGNOSIS — F4310 Post-traumatic stress disorder, unspecified: Secondary | ICD-10-CM | POA: Diagnosis not present

## 2013-06-22 DIAGNOSIS — F3131 Bipolar disorder, current episode depressed, mild: Secondary | ICD-10-CM | POA: Diagnosis not present

## 2013-06-22 DIAGNOSIS — F4310 Post-traumatic stress disorder, unspecified: Secondary | ICD-10-CM | POA: Diagnosis not present

## 2013-06-29 DIAGNOSIS — F4310 Post-traumatic stress disorder, unspecified: Secondary | ICD-10-CM | POA: Diagnosis not present

## 2013-06-29 DIAGNOSIS — F3131 Bipolar disorder, current episode depressed, mild: Secondary | ICD-10-CM | POA: Diagnosis not present

## 2013-07-04 DIAGNOSIS — E109 Type 1 diabetes mellitus without complications: Secondary | ICD-10-CM | POA: Diagnosis not present

## 2013-07-06 DIAGNOSIS — F4310 Post-traumatic stress disorder, unspecified: Secondary | ICD-10-CM | POA: Diagnosis not present

## 2013-07-06 DIAGNOSIS — F3131 Bipolar disorder, current episode depressed, mild: Secondary | ICD-10-CM | POA: Diagnosis not present

## 2013-07-18 DIAGNOSIS — F3131 Bipolar disorder, current episode depressed, mild: Secondary | ICD-10-CM | POA: Diagnosis not present

## 2013-07-18 DIAGNOSIS — F4310 Post-traumatic stress disorder, unspecified: Secondary | ICD-10-CM | POA: Diagnosis not present

## 2013-08-03 DIAGNOSIS — F3131 Bipolar disorder, current episode depressed, mild: Secondary | ICD-10-CM | POA: Diagnosis not present

## 2013-08-03 DIAGNOSIS — F4310 Post-traumatic stress disorder, unspecified: Secondary | ICD-10-CM | POA: Diagnosis not present

## 2013-08-10 DIAGNOSIS — F3131 Bipolar disorder, current episode depressed, mild: Secondary | ICD-10-CM | POA: Diagnosis not present

## 2013-08-10 DIAGNOSIS — F4310 Post-traumatic stress disorder, unspecified: Secondary | ICD-10-CM | POA: Diagnosis not present

## 2013-08-18 DIAGNOSIS — F4310 Post-traumatic stress disorder, unspecified: Secondary | ICD-10-CM | POA: Diagnosis not present

## 2013-08-18 DIAGNOSIS — F3131 Bipolar disorder, current episode depressed, mild: Secondary | ICD-10-CM | POA: Diagnosis not present

## 2013-08-24 DIAGNOSIS — F4310 Post-traumatic stress disorder, unspecified: Secondary | ICD-10-CM | POA: Diagnosis not present

## 2013-08-24 DIAGNOSIS — F3131 Bipolar disorder, current episode depressed, mild: Secondary | ICD-10-CM | POA: Diagnosis not present

## 2013-08-27 DIAGNOSIS — IMO0002 Reserved for concepts with insufficient information to code with codable children: Secondary | ICD-10-CM | POA: Diagnosis not present

## 2013-08-27 DIAGNOSIS — I10 Essential (primary) hypertension: Secondary | ICD-10-CM | POA: Diagnosis not present

## 2013-08-27 DIAGNOSIS — E789 Disorder of lipoprotein metabolism, unspecified: Secondary | ICD-10-CM | POA: Diagnosis not present

## 2013-08-27 DIAGNOSIS — F3175 Bipolar disorder, in partial remission, most recent episode depressed: Secondary | ICD-10-CM | POA: Diagnosis not present

## 2013-08-27 DIAGNOSIS — E119 Type 2 diabetes mellitus without complications: Secondary | ICD-10-CM | POA: Diagnosis not present

## 2013-08-29 DIAGNOSIS — IMO0001 Reserved for inherently not codable concepts without codable children: Secondary | ICD-10-CM | POA: Diagnosis not present

## 2013-08-29 DIAGNOSIS — I10 Essential (primary) hypertension: Secondary | ICD-10-CM | POA: Diagnosis not present

## 2013-08-29 DIAGNOSIS — Z79899 Other long term (current) drug therapy: Secondary | ICD-10-CM | POA: Diagnosis not present

## 2013-08-29 DIAGNOSIS — E7800 Pure hypercholesterolemia, unspecified: Secondary | ICD-10-CM | POA: Diagnosis not present

## 2013-08-29 DIAGNOSIS — M810 Age-related osteoporosis without current pathological fracture: Secondary | ICD-10-CM | POA: Diagnosis not present

## 2013-08-31 DIAGNOSIS — F3175 Bipolar disorder, in partial remission, most recent episode depressed: Secondary | ICD-10-CM | POA: Diagnosis not present

## 2013-08-31 DIAGNOSIS — IMO0002 Reserved for concepts with insufficient information to code with codable children: Secondary | ICD-10-CM | POA: Diagnosis not present

## 2013-08-31 DIAGNOSIS — E119 Type 2 diabetes mellitus without complications: Secondary | ICD-10-CM | POA: Diagnosis not present

## 2013-09-07 DIAGNOSIS — E0789 Other specified disorders of thyroid: Secondary | ICD-10-CM | POA: Diagnosis not present

## 2013-09-10 DIAGNOSIS — E119 Type 2 diabetes mellitus without complications: Secondary | ICD-10-CM | POA: Diagnosis not present

## 2013-09-10 DIAGNOSIS — I10 Essential (primary) hypertension: Secondary | ICD-10-CM | POA: Diagnosis not present

## 2013-09-10 DIAGNOSIS — E789 Disorder of lipoprotein metabolism, unspecified: Secondary | ICD-10-CM | POA: Diagnosis not present

## 2013-11-02 DIAGNOSIS — F3175 Bipolar disorder, in partial remission, most recent episode depressed: Secondary | ICD-10-CM | POA: Diagnosis not present

## 2013-11-02 DIAGNOSIS — IMO0002 Reserved for concepts with insufficient information to code with codable children: Secondary | ICD-10-CM | POA: Diagnosis not present

## 2013-11-09 DIAGNOSIS — F3131 Bipolar disorder, current episode depressed, mild: Secondary | ICD-10-CM | POA: Diagnosis not present

## 2013-11-16 DIAGNOSIS — R51 Headache: Secondary | ICD-10-CM | POA: Diagnosis not present

## 2013-11-16 DIAGNOSIS — R112 Nausea with vomiting, unspecified: Secondary | ICD-10-CM | POA: Diagnosis not present

## 2013-11-16 DIAGNOSIS — F319 Bipolar disorder, unspecified: Secondary | ICD-10-CM | POA: Diagnosis not present

## 2013-11-16 DIAGNOSIS — E1169 Type 2 diabetes mellitus with other specified complication: Secondary | ICD-10-CM | POA: Diagnosis not present

## 2013-11-16 DIAGNOSIS — I10 Essential (primary) hypertension: Secondary | ICD-10-CM | POA: Diagnosis not present

## 2013-11-16 DIAGNOSIS — Z79899 Other long term (current) drug therapy: Secondary | ICD-10-CM | POA: Diagnosis not present

## 2013-11-16 DIAGNOSIS — H9190 Unspecified hearing loss, unspecified ear: Secondary | ICD-10-CM | POA: Diagnosis not present

## 2013-11-17 DIAGNOSIS — R51 Headache: Secondary | ICD-10-CM | POA: Diagnosis not present

## 2013-11-23 DIAGNOSIS — F3131 Bipolar disorder, current episode depressed, mild: Secondary | ICD-10-CM | POA: Diagnosis not present

## 2013-11-29 DIAGNOSIS — E109 Type 1 diabetes mellitus without complications: Secondary | ICD-10-CM | POA: Diagnosis not present

## 2013-11-29 DIAGNOSIS — E119 Type 2 diabetes mellitus without complications: Secondary | ICD-10-CM | POA: Diagnosis not present

## 2013-11-29 DIAGNOSIS — I10 Essential (primary) hypertension: Secondary | ICD-10-CM | POA: Diagnosis not present

## 2013-11-30 DIAGNOSIS — F3131 Bipolar disorder, current episode depressed, mild: Secondary | ICD-10-CM | POA: Diagnosis not present

## 2013-12-07 DIAGNOSIS — F3131 Bipolar disorder, current episode depressed, mild: Secondary | ICD-10-CM | POA: Diagnosis not present

## 2013-12-14 DIAGNOSIS — F3131 Bipolar disorder, current episode depressed, mild: Secondary | ICD-10-CM | POA: Diagnosis not present

## 2013-12-21 DIAGNOSIS — F3131 Bipolar disorder, current episode depressed, mild: Secondary | ICD-10-CM | POA: Diagnosis not present

## 2013-12-28 DIAGNOSIS — F3131 Bipolar disorder, current episode depressed, mild: Secondary | ICD-10-CM | POA: Diagnosis not present

## 2014-01-02 DIAGNOSIS — M810 Age-related osteoporosis without current pathological fracture: Secondary | ICD-10-CM | POA: Diagnosis not present

## 2014-01-04 DIAGNOSIS — F4321 Adjustment disorder with depressed mood: Secondary | ICD-10-CM | POA: Diagnosis not present

## 2014-01-04 DIAGNOSIS — F3131 Bipolar disorder, current episode depressed, mild: Secondary | ICD-10-CM | POA: Diagnosis not present

## 2014-01-08 DIAGNOSIS — E119 Type 2 diabetes mellitus without complications: Secondary | ICD-10-CM | POA: Diagnosis not present

## 2014-01-08 DIAGNOSIS — R51 Headache: Secondary | ICD-10-CM | POA: Diagnosis not present

## 2014-01-15 DIAGNOSIS — F3175 Bipolar disorder, in partial remission, most recent episode depressed: Secondary | ICD-10-CM | POA: Diagnosis not present

## 2014-01-15 DIAGNOSIS — F4321 Adjustment disorder with depressed mood: Secondary | ICD-10-CM | POA: Diagnosis not present

## 2014-01-15 DIAGNOSIS — IMO0002 Reserved for concepts with insufficient information to code with codable children: Secondary | ICD-10-CM | POA: Diagnosis not present

## 2014-01-17 DIAGNOSIS — F4321 Adjustment disorder with depressed mood: Secondary | ICD-10-CM | POA: Diagnosis not present

## 2014-01-17 DIAGNOSIS — F3131 Bipolar disorder, current episode depressed, mild: Secondary | ICD-10-CM | POA: Diagnosis not present

## 2014-01-25 IMAGING — CR DG KNEE COMPLETE 4+V*L*
1 series · 1 of 1 positions shown · non-contrast
Comparison: None.

CLINICAL DATA: Fall, knee pain

LEFT KNEE - COMPLETE 4+ VIEW

[view not recorded]
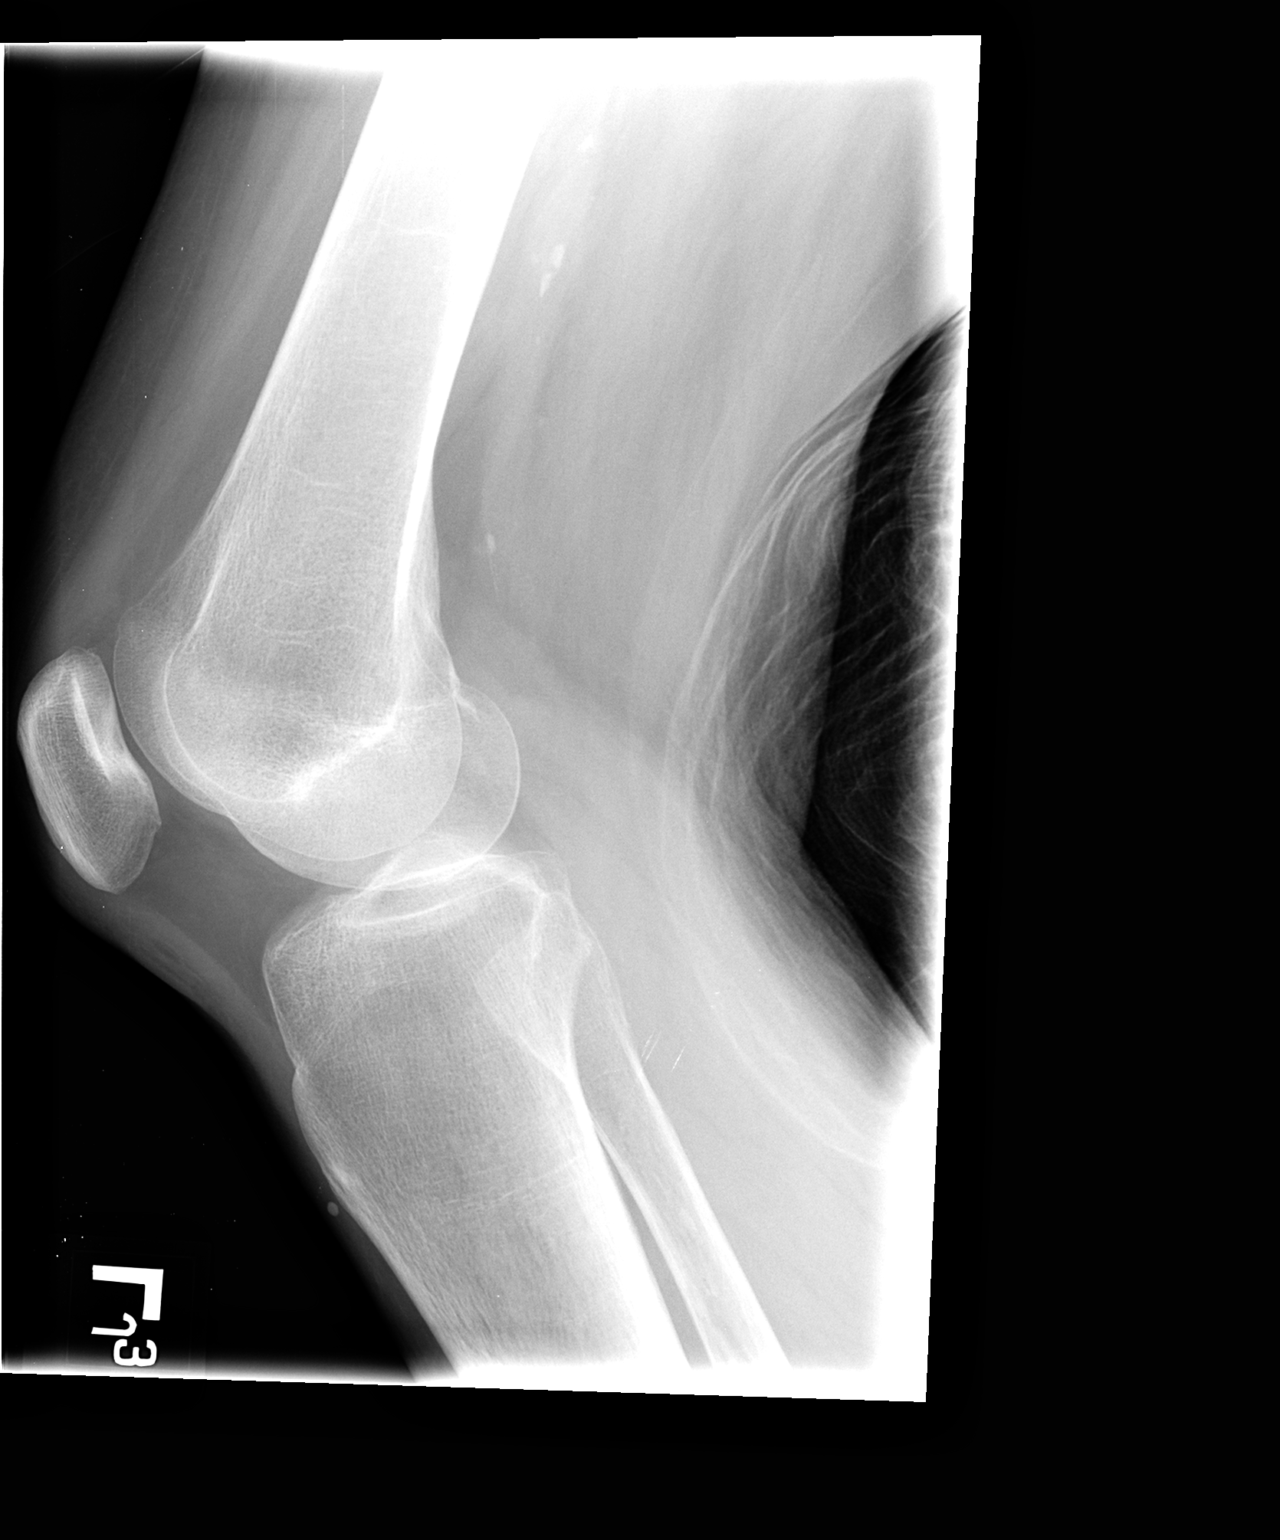

[1 of 1 positions shown; findings below may reference images not displayed]

FINDINGS: No fracture or dislocation is seen.

Joint spaces are essentially preserved.  Very mild patellofemoral
spurring.

The visualized soft tissues are unremarkable.

No suprapatellar knee joint effusion.
IMPRESSION: No fracture or dislocation is seen.

## 2014-01-26 IMAGING — CR DG CERVICAL SPINE 1V
1 series · 1 of 1 positions shown · non-contrast
Comparison: MR cervical spine 08/31/2011.

CLINICAL DATA: Status post cervical fusion.

DG CERVICAL SPINE - 1 VIEW

[w c-spine lat *]
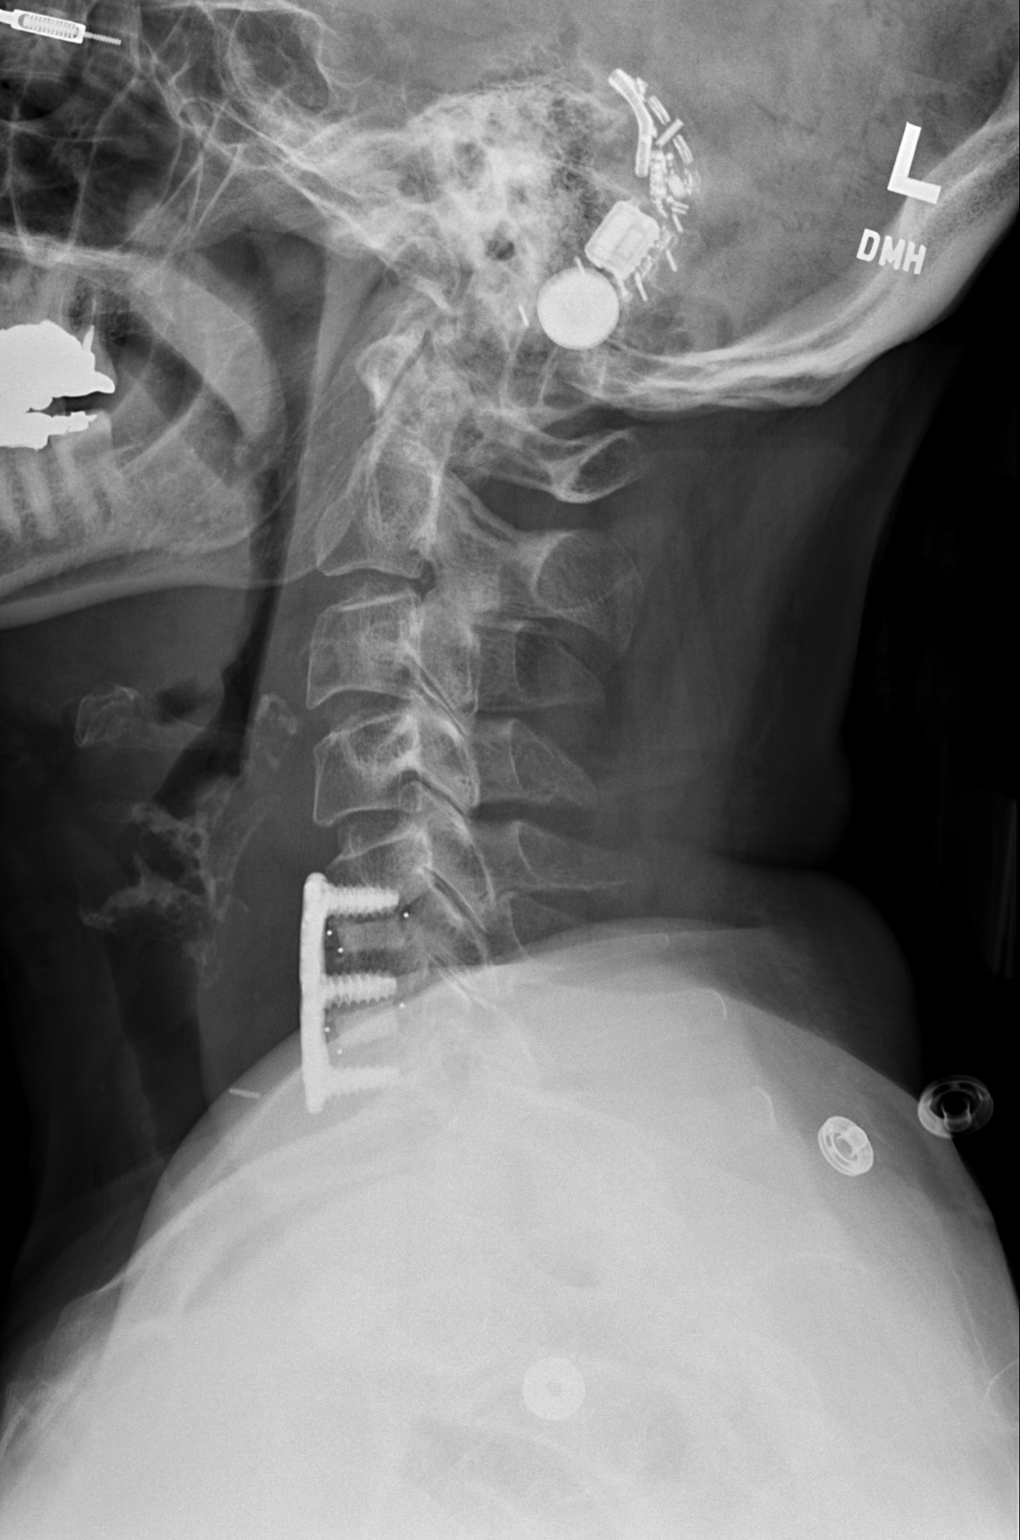

[1 of 1 positions shown; findings below may reference images not displayed]

FINDINGS: Since the MRI, the patient has undergone C5-7 ACDF.
Vertebral body height and alignment are maintained.  Hardware is
intact.
IMPRESSION: C5-7 ACDF.  No acute finding.

## 2014-02-01 DIAGNOSIS — F3131 Bipolar disorder, current episode depressed, mild: Secondary | ICD-10-CM | POA: Diagnosis not present

## 2014-02-08 DIAGNOSIS — F3131 Bipolar disorder, current episode depressed, mild: Secondary | ICD-10-CM | POA: Diagnosis not present

## 2014-03-15 DIAGNOSIS — F3131 Bipolar disorder, current episode depressed, mild: Secondary | ICD-10-CM | POA: Diagnosis not present

## 2014-03-22 DIAGNOSIS — F3131 Bipolar disorder, current episode depressed, mild: Secondary | ICD-10-CM | POA: Diagnosis not present

## 2014-03-29 DIAGNOSIS — F3131 Bipolar disorder, current episode depressed, mild: Secondary | ICD-10-CM | POA: Diagnosis not present

## 2014-04-05 DIAGNOSIS — F3131 Bipolar disorder, current episode depressed, mild: Secondary | ICD-10-CM | POA: Diagnosis not present

## 2014-04-24 DIAGNOSIS — E756 Lipid storage disorder, unspecified: Secondary | ICD-10-CM | POA: Diagnosis not present

## 2014-04-24 DIAGNOSIS — Z79899 Other long term (current) drug therapy: Secondary | ICD-10-CM | POA: Diagnosis not present

## 2014-04-24 DIAGNOSIS — E119 Type 2 diabetes mellitus without complications: Secondary | ICD-10-CM | POA: Diagnosis not present

## 2014-04-26 DIAGNOSIS — F3131 Bipolar disorder, current episode depressed, mild: Secondary | ICD-10-CM | POA: Diagnosis not present

## 2014-04-30 DIAGNOSIS — L249 Irritant contact dermatitis, unspecified cause: Secondary | ICD-10-CM | POA: Diagnosis not present

## 2014-04-30 DIAGNOSIS — E789 Disorder of lipoprotein metabolism, unspecified: Secondary | ICD-10-CM | POA: Diagnosis not present

## 2014-04-30 DIAGNOSIS — I10 Essential (primary) hypertension: Secondary | ICD-10-CM | POA: Diagnosis not present

## 2014-04-30 DIAGNOSIS — E559 Vitamin D deficiency, unspecified: Secondary | ICD-10-CM | POA: Diagnosis not present

## 2014-04-30 DIAGNOSIS — Z23 Encounter for immunization: Secondary | ICD-10-CM | POA: Diagnosis not present

## 2014-05-03 DIAGNOSIS — F3131 Bipolar disorder, current episode depressed, mild: Secondary | ICD-10-CM | POA: Diagnosis not present

## 2014-05-10 DIAGNOSIS — F3131 Bipolar disorder, current episode depressed, mild: Secondary | ICD-10-CM | POA: Diagnosis not present

## 2014-05-15 DIAGNOSIS — F4310 Post-traumatic stress disorder, unspecified: Secondary | ICD-10-CM | POA: Diagnosis not present

## 2014-05-15 DIAGNOSIS — F410 Panic disorder [episodic paroxysmal anxiety] without agoraphobia: Secondary | ICD-10-CM | POA: Diagnosis not present

## 2014-05-15 DIAGNOSIS — F3175 Bipolar disorder, in partial remission, most recent episode depressed: Secondary | ICD-10-CM | POA: Diagnosis not present

## 2014-05-17 DIAGNOSIS — F3175 Bipolar disorder, in partial remission, most recent episode depressed: Secondary | ICD-10-CM | POA: Diagnosis not present

## 2014-05-17 DIAGNOSIS — F4310 Post-traumatic stress disorder, unspecified: Secondary | ICD-10-CM | POA: Diagnosis not present

## 2014-05-17 DIAGNOSIS — F410 Panic disorder [episodic paroxysmal anxiety] without agoraphobia: Secondary | ICD-10-CM | POA: Diagnosis not present

## 2014-06-07 DIAGNOSIS — F410 Panic disorder [episodic paroxysmal anxiety] without agoraphobia: Secondary | ICD-10-CM | POA: Diagnosis not present

## 2014-06-07 DIAGNOSIS — F4310 Post-traumatic stress disorder, unspecified: Secondary | ICD-10-CM | POA: Diagnosis not present

## 2014-06-07 DIAGNOSIS — F3175 Bipolar disorder, in partial remission, most recent episode depressed: Secondary | ICD-10-CM | POA: Diagnosis not present

## 2014-06-20 DIAGNOSIS — F3131 Bipolar disorder, current episode depressed, mild: Secondary | ICD-10-CM | POA: Diagnosis not present

## 2014-06-20 DIAGNOSIS — F4310 Post-traumatic stress disorder, unspecified: Secondary | ICD-10-CM | POA: Diagnosis not present

## 2014-06-20 DIAGNOSIS — F410 Panic disorder [episodic paroxysmal anxiety] without agoraphobia: Secondary | ICD-10-CM | POA: Diagnosis not present

## 2014-06-27 DIAGNOSIS — E109 Type 1 diabetes mellitus without complications: Secondary | ICD-10-CM | POA: Diagnosis not present

## 2014-06-28 DIAGNOSIS — F3131 Bipolar disorder, current episode depressed, mild: Secondary | ICD-10-CM | POA: Diagnosis not present

## 2014-06-28 DIAGNOSIS — F410 Panic disorder [episodic paroxysmal anxiety] without agoraphobia: Secondary | ICD-10-CM | POA: Diagnosis not present

## 2014-06-28 DIAGNOSIS — F4310 Post-traumatic stress disorder, unspecified: Secondary | ICD-10-CM | POA: Diagnosis not present

## 2014-09-19 DIAGNOSIS — F4310 Post-traumatic stress disorder, unspecified: Secondary | ICD-10-CM | POA: Diagnosis not present

## 2014-09-19 DIAGNOSIS — F3132 Bipolar disorder, current episode depressed, moderate: Secondary | ICD-10-CM | POA: Diagnosis not present

## 2014-09-19 DIAGNOSIS — F410 Panic disorder [episodic paroxysmal anxiety] without agoraphobia: Secondary | ICD-10-CM | POA: Diagnosis not present

## 2014-09-26 DIAGNOSIS — F4310 Post-traumatic stress disorder, unspecified: Secondary | ICD-10-CM | POA: Diagnosis not present

## 2014-09-26 DIAGNOSIS — F410 Panic disorder [episodic paroxysmal anxiety] without agoraphobia: Secondary | ICD-10-CM | POA: Diagnosis not present

## 2014-09-26 DIAGNOSIS — F3132 Bipolar disorder, current episode depressed, moderate: Secondary | ICD-10-CM | POA: Diagnosis not present

## 2014-09-30 DIAGNOSIS — E109 Type 1 diabetes mellitus without complications: Secondary | ICD-10-CM | POA: Diagnosis not present

## 2014-09-30 DIAGNOSIS — E039 Hypothyroidism, unspecified: Secondary | ICD-10-CM | POA: Diagnosis not present

## 2014-10-03 DIAGNOSIS — F3132 Bipolar disorder, current episode depressed, moderate: Secondary | ICD-10-CM | POA: Diagnosis not present

## 2014-10-03 DIAGNOSIS — F4310 Post-traumatic stress disorder, unspecified: Secondary | ICD-10-CM | POA: Diagnosis not present

## 2014-10-03 DIAGNOSIS — F410 Panic disorder [episodic paroxysmal anxiety] without agoraphobia: Secondary | ICD-10-CM | POA: Diagnosis not present

## 2014-10-04 DIAGNOSIS — E118 Type 2 diabetes mellitus with unspecified complications: Secondary | ICD-10-CM | POA: Diagnosis not present

## 2014-10-04 DIAGNOSIS — E039 Hypothyroidism, unspecified: Secondary | ICD-10-CM | POA: Diagnosis not present

## 2014-10-10 DIAGNOSIS — F410 Panic disorder [episodic paroxysmal anxiety] without agoraphobia: Secondary | ICD-10-CM | POA: Diagnosis not present

## 2014-10-10 DIAGNOSIS — I10 Essential (primary) hypertension: Secondary | ICD-10-CM | POA: Diagnosis not present

## 2014-10-10 DIAGNOSIS — F3132 Bipolar disorder, current episode depressed, moderate: Secondary | ICD-10-CM | POA: Diagnosis not present

## 2014-10-10 DIAGNOSIS — E118 Type 2 diabetes mellitus with unspecified complications: Secondary | ICD-10-CM | POA: Diagnosis not present

## 2014-10-10 DIAGNOSIS — F4310 Post-traumatic stress disorder, unspecified: Secondary | ICD-10-CM | POA: Diagnosis not present

## 2014-10-10 DIAGNOSIS — R630 Anorexia: Secondary | ICD-10-CM | POA: Diagnosis not present

## 2014-11-14 DIAGNOSIS — F4310 Post-traumatic stress disorder, unspecified: Secondary | ICD-10-CM | POA: Diagnosis not present

## 2014-11-14 DIAGNOSIS — F3132 Bipolar disorder, current episode depressed, moderate: Secondary | ICD-10-CM | POA: Diagnosis not present

## 2014-11-14 DIAGNOSIS — F410 Panic disorder [episodic paroxysmal anxiety] without agoraphobia: Secondary | ICD-10-CM | POA: Diagnosis not present

## 2014-11-19 DIAGNOSIS — F3132 Bipolar disorder, current episode depressed, moderate: Secondary | ICD-10-CM | POA: Diagnosis not present

## 2014-11-19 DIAGNOSIS — F410 Panic disorder [episodic paroxysmal anxiety] without agoraphobia: Secondary | ICD-10-CM | POA: Diagnosis not present

## 2014-11-28 DIAGNOSIS — F4310 Post-traumatic stress disorder, unspecified: Secondary | ICD-10-CM | POA: Diagnosis not present

## 2014-11-28 DIAGNOSIS — F3132 Bipolar disorder, current episode depressed, moderate: Secondary | ICD-10-CM | POA: Diagnosis not present

## 2014-11-28 DIAGNOSIS — F410 Panic disorder [episodic paroxysmal anxiety] without agoraphobia: Secondary | ICD-10-CM | POA: Diagnosis not present

## 2014-12-05 DIAGNOSIS — F3132 Bipolar disorder, current episode depressed, moderate: Secondary | ICD-10-CM | POA: Diagnosis not present

## 2014-12-05 DIAGNOSIS — F4310 Post-traumatic stress disorder, unspecified: Secondary | ICD-10-CM | POA: Diagnosis not present

## 2014-12-05 DIAGNOSIS — F410 Panic disorder [episodic paroxysmal anxiety] without agoraphobia: Secondary | ICD-10-CM | POA: Diagnosis not present

## 2015-01-02 DIAGNOSIS — F4310 Post-traumatic stress disorder, unspecified: Secondary | ICD-10-CM | POA: Diagnosis not present

## 2015-01-02 DIAGNOSIS — F3132 Bipolar disorder, current episode depressed, moderate: Secondary | ICD-10-CM | POA: Diagnosis not present

## 2015-01-02 DIAGNOSIS — F410 Panic disorder [episodic paroxysmal anxiety] without agoraphobia: Secondary | ICD-10-CM | POA: Diagnosis not present

## 2015-01-03 DIAGNOSIS — E109 Type 1 diabetes mellitus without complications: Secondary | ICD-10-CM | POA: Diagnosis not present

## 2015-01-03 DIAGNOSIS — E559 Vitamin D deficiency, unspecified: Secondary | ICD-10-CM | POA: Diagnosis not present

## 2015-01-03 DIAGNOSIS — I10 Essential (primary) hypertension: Secondary | ICD-10-CM | POA: Diagnosis not present

## 2015-01-03 DIAGNOSIS — E756 Lipid storage disorder, unspecified: Secondary | ICD-10-CM | POA: Diagnosis not present

## 2015-01-03 DIAGNOSIS — E039 Hypothyroidism, unspecified: Secondary | ICD-10-CM | POA: Diagnosis not present

## 2015-01-10 DIAGNOSIS — F4310 Post-traumatic stress disorder, unspecified: Secondary | ICD-10-CM | POA: Diagnosis not present

## 2015-01-10 DIAGNOSIS — F410 Panic disorder [episodic paroxysmal anxiety] without agoraphobia: Secondary | ICD-10-CM | POA: Diagnosis not present

## 2015-01-10 DIAGNOSIS — F3132 Bipolar disorder, current episode depressed, moderate: Secondary | ICD-10-CM | POA: Diagnosis not present

## 2015-01-14 DIAGNOSIS — E032 Hypothyroidism due to medicaments and other exogenous substances: Secondary | ICD-10-CM | POA: Diagnosis not present

## 2015-01-14 DIAGNOSIS — E109 Type 1 diabetes mellitus without complications: Secondary | ICD-10-CM | POA: Diagnosis not present

## 2015-01-17 DIAGNOSIS — F4310 Post-traumatic stress disorder, unspecified: Secondary | ICD-10-CM | POA: Diagnosis not present

## 2015-01-17 DIAGNOSIS — F3132 Bipolar disorder, current episode depressed, moderate: Secondary | ICD-10-CM | POA: Diagnosis not present

## 2015-01-17 DIAGNOSIS — F410 Panic disorder [episodic paroxysmal anxiety] without agoraphobia: Secondary | ICD-10-CM | POA: Diagnosis not present

## 2015-01-22 DIAGNOSIS — E109 Type 1 diabetes mellitus without complications: Secondary | ICD-10-CM | POA: Diagnosis not present

## 2015-01-22 DIAGNOSIS — I10 Essential (primary) hypertension: Secondary | ICD-10-CM | POA: Diagnosis not present

## 2015-01-22 DIAGNOSIS — E780 Pure hypercholesterolemia: Secondary | ICD-10-CM | POA: Diagnosis not present

## 2015-01-22 DIAGNOSIS — E039 Hypothyroidism, unspecified: Secondary | ICD-10-CM | POA: Diagnosis not present

## 2015-01-24 DIAGNOSIS — F410 Panic disorder [episodic paroxysmal anxiety] without agoraphobia: Secondary | ICD-10-CM | POA: Diagnosis not present

## 2015-01-24 DIAGNOSIS — F4310 Post-traumatic stress disorder, unspecified: Secondary | ICD-10-CM | POA: Diagnosis not present

## 2015-01-24 DIAGNOSIS — F3132 Bipolar disorder, current episode depressed, moderate: Secondary | ICD-10-CM | POA: Diagnosis not present

## 2015-01-31 DIAGNOSIS — F4310 Post-traumatic stress disorder, unspecified: Secondary | ICD-10-CM | POA: Diagnosis not present

## 2015-01-31 DIAGNOSIS — F3132 Bipolar disorder, current episode depressed, moderate: Secondary | ICD-10-CM | POA: Diagnosis not present

## 2015-01-31 DIAGNOSIS — F410 Panic disorder [episodic paroxysmal anxiety] without agoraphobia: Secondary | ICD-10-CM | POA: Diagnosis not present

## 2015-02-14 DIAGNOSIS — F3131 Bipolar disorder, current episode depressed, mild: Secondary | ICD-10-CM | POA: Diagnosis not present

## 2015-02-14 DIAGNOSIS — F4310 Post-traumatic stress disorder, unspecified: Secondary | ICD-10-CM | POA: Diagnosis not present

## 2015-02-14 DIAGNOSIS — F410 Panic disorder [episodic paroxysmal anxiety] without agoraphobia: Secondary | ICD-10-CM | POA: Diagnosis not present

## 2015-02-19 DIAGNOSIS — F4312 Post-traumatic stress disorder, chronic: Secondary | ICD-10-CM | POA: Diagnosis not present

## 2015-02-19 DIAGNOSIS — F3175 Bipolar disorder, in partial remission, most recent episode depressed: Secondary | ICD-10-CM | POA: Diagnosis not present

## 2015-03-05 DIAGNOSIS — E039 Hypothyroidism, unspecified: Secondary | ICD-10-CM | POA: Diagnosis not present

## 2015-03-07 DIAGNOSIS — F3175 Bipolar disorder, in partial remission, most recent episode depressed: Secondary | ICD-10-CM | POA: Diagnosis not present

## 2015-03-07 DIAGNOSIS — F4312 Post-traumatic stress disorder, chronic: Secondary | ICD-10-CM | POA: Diagnosis not present

## 2015-03-12 DIAGNOSIS — M549 Dorsalgia, unspecified: Secondary | ICD-10-CM | POA: Diagnosis not present

## 2015-03-12 DIAGNOSIS — E118 Type 2 diabetes mellitus with unspecified complications: Secondary | ICD-10-CM | POA: Diagnosis not present

## 2015-03-12 DIAGNOSIS — E789 Disorder of lipoprotein metabolism, unspecified: Secondary | ICD-10-CM | POA: Diagnosis not present

## 2015-03-12 DIAGNOSIS — E032 Hypothyroidism due to medicaments and other exogenous substances: Secondary | ICD-10-CM | POA: Diagnosis not present

## 2015-03-20 DIAGNOSIS — E109 Type 1 diabetes mellitus without complications: Secondary | ICD-10-CM | POA: Diagnosis not present

## 2015-03-20 DIAGNOSIS — R739 Hyperglycemia, unspecified: Secondary | ICD-10-CM | POA: Diagnosis not present

## 2015-03-20 DIAGNOSIS — E1010 Type 1 diabetes mellitus with ketoacidosis without coma: Secondary | ICD-10-CM | POA: Diagnosis not present

## 2015-03-20 DIAGNOSIS — E039 Hypothyroidism, unspecified: Secondary | ICD-10-CM | POA: Diagnosis not present

## 2015-03-20 DIAGNOSIS — F329 Major depressive disorder, single episode, unspecified: Secondary | ICD-10-CM | POA: Diagnosis not present

## 2015-03-20 DIAGNOSIS — F17210 Nicotine dependence, cigarettes, uncomplicated: Secondary | ICD-10-CM | POA: Diagnosis present

## 2015-03-20 DIAGNOSIS — E872 Acidosis: Secondary | ICD-10-CM | POA: Diagnosis not present

## 2015-03-20 DIAGNOSIS — Z794 Long term (current) use of insulin: Secondary | ICD-10-CM | POA: Diagnosis not present

## 2015-03-20 DIAGNOSIS — E1310 Other specified diabetes mellitus with ketoacidosis without coma: Secondary | ICD-10-CM | POA: Diagnosis not present

## 2015-03-20 DIAGNOSIS — T85694A Other mechanical complication of insulin pump, initial encounter: Secondary | ICD-10-CM | POA: Diagnosis present

## 2015-03-20 DIAGNOSIS — D751 Secondary polycythemia: Secondary | ICD-10-CM | POA: Diagnosis not present

## 2015-03-20 DIAGNOSIS — Z9641 Presence of insulin pump (external) (internal): Secondary | ICD-10-CM | POA: Diagnosis present

## 2015-03-20 DIAGNOSIS — D72829 Elevated white blood cell count, unspecified: Secondary | ICD-10-CM | POA: Diagnosis not present

## 2015-03-20 DIAGNOSIS — R110 Nausea: Secondary | ICD-10-CM | POA: Diagnosis not present

## 2015-03-20 DIAGNOSIS — E785 Hyperlipidemia, unspecified: Secondary | ICD-10-CM | POA: Diagnosis not present

## 2015-03-20 DIAGNOSIS — K219 Gastro-esophageal reflux disease without esophagitis: Secondary | ICD-10-CM | POA: Diagnosis not present

## 2015-03-20 DIAGNOSIS — R112 Nausea with vomiting, unspecified: Secondary | ICD-10-CM | POA: Diagnosis not present

## 2015-03-28 DIAGNOSIS — F4312 Post-traumatic stress disorder, chronic: Secondary | ICD-10-CM | POA: Diagnosis not present

## 2015-03-28 DIAGNOSIS — F3175 Bipolar disorder, in partial remission, most recent episode depressed: Secondary | ICD-10-CM | POA: Diagnosis not present

## 2015-04-07 DIAGNOSIS — E039 Hypothyroidism, unspecified: Secondary | ICD-10-CM | POA: Diagnosis not present

## 2015-04-07 DIAGNOSIS — E109 Type 1 diabetes mellitus without complications: Secondary | ICD-10-CM | POA: Diagnosis not present

## 2015-04-07 DIAGNOSIS — K3184 Gastroparesis: Secondary | ICD-10-CM | POA: Diagnosis not present

## 2015-04-07 DIAGNOSIS — E780 Pure hypercholesterolemia: Secondary | ICD-10-CM | POA: Diagnosis not present

## 2015-04-11 DIAGNOSIS — F4312 Post-traumatic stress disorder, chronic: Secondary | ICD-10-CM | POA: Diagnosis not present

## 2015-04-11 DIAGNOSIS — F3175 Bipolar disorder, in partial remission, most recent episode depressed: Secondary | ICD-10-CM | POA: Diagnosis not present

## 2015-04-18 DIAGNOSIS — F4312 Post-traumatic stress disorder, chronic: Secondary | ICD-10-CM | POA: Diagnosis not present

## 2015-04-18 DIAGNOSIS — F3175 Bipolar disorder, in partial remission, most recent episode depressed: Secondary | ICD-10-CM | POA: Diagnosis not present

## 2015-04-24 DIAGNOSIS — E109 Type 1 diabetes mellitus without complications: Secondary | ICD-10-CM | POA: Diagnosis not present

## 2015-04-24 DIAGNOSIS — I10 Essential (primary) hypertension: Secondary | ICD-10-CM | POA: Diagnosis not present

## 2015-04-24 DIAGNOSIS — Z23 Encounter for immunization: Secondary | ICD-10-CM | POA: Diagnosis not present

## 2015-04-24 DIAGNOSIS — E039 Hypothyroidism, unspecified: Secondary | ICD-10-CM | POA: Diagnosis not present

## 2015-04-24 DIAGNOSIS — Z9641 Presence of insulin pump (external) (internal): Secondary | ICD-10-CM | POA: Diagnosis not present

## 2015-04-25 DIAGNOSIS — F3175 Bipolar disorder, in partial remission, most recent episode depressed: Secondary | ICD-10-CM | POA: Diagnosis not present

## 2015-04-25 DIAGNOSIS — F4312 Post-traumatic stress disorder, chronic: Secondary | ICD-10-CM | POA: Diagnosis not present

## 2015-05-16 DIAGNOSIS — F4312 Post-traumatic stress disorder, chronic: Secondary | ICD-10-CM | POA: Diagnosis not present

## 2015-05-16 DIAGNOSIS — F3175 Bipolar disorder, in partial remission, most recent episode depressed: Secondary | ICD-10-CM | POA: Diagnosis not present

## 2015-05-23 DIAGNOSIS — F3175 Bipolar disorder, in partial remission, most recent episode depressed: Secondary | ICD-10-CM | POA: Diagnosis not present

## 2015-05-23 DIAGNOSIS — F4312 Post-traumatic stress disorder, chronic: Secondary | ICD-10-CM | POA: Diagnosis not present

## 2015-05-29 DIAGNOSIS — F3175 Bipolar disorder, in partial remission, most recent episode depressed: Secondary | ICD-10-CM | POA: Diagnosis not present

## 2015-05-29 DIAGNOSIS — F4312 Post-traumatic stress disorder, chronic: Secondary | ICD-10-CM | POA: Diagnosis not present

## 2015-06-06 DIAGNOSIS — F3175 Bipolar disorder, in partial remission, most recent episode depressed: Secondary | ICD-10-CM | POA: Diagnosis not present

## 2015-06-06 DIAGNOSIS — F4312 Post-traumatic stress disorder, chronic: Secondary | ICD-10-CM | POA: Diagnosis not present

## 2015-06-27 DIAGNOSIS — F3131 Bipolar disorder, current episode depressed, mild: Secondary | ICD-10-CM | POA: Diagnosis not present

## 2015-06-27 DIAGNOSIS — F4312 Post-traumatic stress disorder, chronic: Secondary | ICD-10-CM | POA: Diagnosis not present

## 2015-07-08 DIAGNOSIS — F4312 Post-traumatic stress disorder, chronic: Secondary | ICD-10-CM | POA: Diagnosis not present

## 2015-07-08 DIAGNOSIS — F3175 Bipolar disorder, in partial remission, most recent episode depressed: Secondary | ICD-10-CM | POA: Diagnosis not present

## 2015-07-22 DIAGNOSIS — E039 Hypothyroidism, unspecified: Secondary | ICD-10-CM | POA: Diagnosis not present

## 2015-07-22 DIAGNOSIS — E109 Type 1 diabetes mellitus without complications: Secondary | ICD-10-CM | POA: Diagnosis not present

## 2015-07-22 DIAGNOSIS — E7800 Pure hypercholesterolemia, unspecified: Secondary | ICD-10-CM | POA: Diagnosis not present

## 2015-07-25 DIAGNOSIS — F3131 Bipolar disorder, current episode depressed, mild: Secondary | ICD-10-CM | POA: Diagnosis not present

## 2015-07-25 DIAGNOSIS — F4312 Post-traumatic stress disorder, chronic: Secondary | ICD-10-CM | POA: Diagnosis not present

## 2015-07-29 DIAGNOSIS — K5901 Slow transit constipation: Secondary | ICD-10-CM | POA: Diagnosis not present

## 2015-07-29 DIAGNOSIS — E109 Type 1 diabetes mellitus without complications: Secondary | ICD-10-CM | POA: Diagnosis not present

## 2015-07-31 DIAGNOSIS — E039 Hypothyroidism, unspecified: Secondary | ICD-10-CM | POA: Diagnosis not present

## 2015-07-31 DIAGNOSIS — I10 Essential (primary) hypertension: Secondary | ICD-10-CM | POA: Diagnosis not present

## 2015-07-31 DIAGNOSIS — E109 Type 1 diabetes mellitus without complications: Secondary | ICD-10-CM | POA: Diagnosis not present

## 2015-07-31 DIAGNOSIS — E7800 Pure hypercholesterolemia, unspecified: Secondary | ICD-10-CM | POA: Diagnosis not present

## 2015-08-02 DIAGNOSIS — F4312 Post-traumatic stress disorder, chronic: Secondary | ICD-10-CM | POA: Diagnosis not present

## 2015-08-02 DIAGNOSIS — F3131 Bipolar disorder, current episode depressed, mild: Secondary | ICD-10-CM | POA: Diagnosis not present

## 2015-08-08 DIAGNOSIS — F4312 Post-traumatic stress disorder, chronic: Secondary | ICD-10-CM | POA: Diagnosis not present

## 2015-08-08 DIAGNOSIS — F3131 Bipolar disorder, current episode depressed, mild: Secondary | ICD-10-CM | POA: Diagnosis not present

## 2015-08-19 DIAGNOSIS — F3131 Bipolar disorder, current episode depressed, mild: Secondary | ICD-10-CM | POA: Diagnosis not present

## 2015-08-22 DIAGNOSIS — F3131 Bipolar disorder, current episode depressed, mild: Secondary | ICD-10-CM | POA: Diagnosis not present

## 2015-08-22 DIAGNOSIS — F4312 Post-traumatic stress disorder, chronic: Secondary | ICD-10-CM | POA: Diagnosis not present

## 2015-08-29 DIAGNOSIS — F4312 Post-traumatic stress disorder, chronic: Secondary | ICD-10-CM | POA: Diagnosis not present

## 2015-08-29 DIAGNOSIS — F3131 Bipolar disorder, current episode depressed, mild: Secondary | ICD-10-CM | POA: Diagnosis not present

## 2015-09-05 DIAGNOSIS — F3131 Bipolar disorder, current episode depressed, mild: Secondary | ICD-10-CM | POA: Diagnosis not present

## 2015-09-05 DIAGNOSIS — F4312 Post-traumatic stress disorder, chronic: Secondary | ICD-10-CM | POA: Diagnosis not present

## 2015-09-12 DIAGNOSIS — F3131 Bipolar disorder, current episode depressed, mild: Secondary | ICD-10-CM | POA: Diagnosis not present

## 2015-09-12 DIAGNOSIS — F4312 Post-traumatic stress disorder, chronic: Secondary | ICD-10-CM | POA: Diagnosis not present

## 2015-09-19 DIAGNOSIS — F4312 Post-traumatic stress disorder, chronic: Secondary | ICD-10-CM | POA: Diagnosis not present

## 2015-09-19 DIAGNOSIS — F3131 Bipolar disorder, current episode depressed, mild: Secondary | ICD-10-CM | POA: Diagnosis not present

## 2015-10-03 DIAGNOSIS — F3131 Bipolar disorder, current episode depressed, mild: Secondary | ICD-10-CM | POA: Diagnosis not present

## 2015-10-03 DIAGNOSIS — F4312 Post-traumatic stress disorder, chronic: Secondary | ICD-10-CM | POA: Diagnosis not present

## 2015-10-17 DIAGNOSIS — F4312 Post-traumatic stress disorder, chronic: Secondary | ICD-10-CM | POA: Diagnosis not present

## 2015-10-17 DIAGNOSIS — F3131 Bipolar disorder, current episode depressed, mild: Secondary | ICD-10-CM | POA: Diagnosis not present

## 2015-10-22 DIAGNOSIS — E109 Type 1 diabetes mellitus without complications: Secondary | ICD-10-CM | POA: Diagnosis not present

## 2015-10-22 DIAGNOSIS — E039 Hypothyroidism, unspecified: Secondary | ICD-10-CM | POA: Diagnosis not present

## 2015-10-22 DIAGNOSIS — Z0000 Encounter for general adult medical examination without abnormal findings: Secondary | ICD-10-CM | POA: Diagnosis not present

## 2015-10-22 DIAGNOSIS — E756 Lipid storage disorder, unspecified: Secondary | ICD-10-CM | POA: Diagnosis not present

## 2015-10-28 DIAGNOSIS — R109 Unspecified abdominal pain: Secondary | ICD-10-CM | POA: Diagnosis not present

## 2015-10-28 DIAGNOSIS — E118 Type 2 diabetes mellitus with unspecified complications: Secondary | ICD-10-CM | POA: Diagnosis not present

## 2015-10-28 DIAGNOSIS — E789 Disorder of lipoprotein metabolism, unspecified: Secondary | ICD-10-CM | POA: Diagnosis not present

## 2015-10-28 DIAGNOSIS — L918 Other hypertrophic disorders of the skin: Secondary | ICD-10-CM | POA: Diagnosis not present

## 2015-10-29 DIAGNOSIS — F4312 Post-traumatic stress disorder, chronic: Secondary | ICD-10-CM | POA: Diagnosis not present

## 2015-10-29 DIAGNOSIS — E789 Disorder of lipoprotein metabolism, unspecified: Secondary | ICD-10-CM | POA: Diagnosis not present

## 2015-10-29 DIAGNOSIS — E109 Type 1 diabetes mellitus without complications: Secondary | ICD-10-CM | POA: Diagnosis not present

## 2015-10-29 DIAGNOSIS — E032 Hypothyroidism due to medicaments and other exogenous substances: Secondary | ICD-10-CM | POA: Diagnosis not present

## 2015-10-29 DIAGNOSIS — Z79899 Other long term (current) drug therapy: Secondary | ICD-10-CM | POA: Diagnosis not present

## 2015-10-29 DIAGNOSIS — F3131 Bipolar disorder, current episode depressed, mild: Secondary | ICD-10-CM | POA: Diagnosis not present

## 2015-10-29 DIAGNOSIS — R109 Unspecified abdominal pain: Secondary | ICD-10-CM | POA: Diagnosis not present

## 2015-10-30 DIAGNOSIS — E109 Type 1 diabetes mellitus without complications: Secondary | ICD-10-CM | POA: Diagnosis not present

## 2015-10-30 DIAGNOSIS — I10 Essential (primary) hypertension: Secondary | ICD-10-CM | POA: Diagnosis not present

## 2015-10-30 DIAGNOSIS — E039 Hypothyroidism, unspecified: Secondary | ICD-10-CM | POA: Diagnosis not present

## 2015-10-30 DIAGNOSIS — E7800 Pure hypercholesterolemia, unspecified: Secondary | ICD-10-CM | POA: Diagnosis not present

## 2015-11-07 DIAGNOSIS — F4312 Post-traumatic stress disorder, chronic: Secondary | ICD-10-CM | POA: Diagnosis not present

## 2015-11-07 DIAGNOSIS — F3131 Bipolar disorder, current episode depressed, mild: Secondary | ICD-10-CM | POA: Diagnosis not present

## 2015-11-12 DIAGNOSIS — R634 Abnormal weight loss: Secondary | ICD-10-CM | POA: Diagnosis not present

## 2015-11-12 DIAGNOSIS — E119 Type 2 diabetes mellitus without complications: Secondary | ICD-10-CM | POA: Diagnosis not present

## 2015-11-12 DIAGNOSIS — R630 Anorexia: Secondary | ICD-10-CM | POA: Diagnosis not present

## 2015-11-12 DIAGNOSIS — Z8379 Family history of other diseases of the digestive system: Secondary | ICD-10-CM | POA: Diagnosis not present

## 2015-11-12 DIAGNOSIS — M791 Myalgia: Secondary | ICD-10-CM | POA: Diagnosis not present

## 2015-11-12 DIAGNOSIS — Z8371 Family history of colonic polyps: Secondary | ICD-10-CM | POA: Diagnosis not present

## 2015-11-12 DIAGNOSIS — R202 Paresthesia of skin: Secondary | ICD-10-CM | POA: Diagnosis not present

## 2015-11-12 DIAGNOSIS — M549 Dorsalgia, unspecified: Secondary | ICD-10-CM | POA: Diagnosis not present

## 2015-11-12 DIAGNOSIS — F419 Anxiety disorder, unspecified: Secondary | ICD-10-CM | POA: Diagnosis not present

## 2015-11-12 DIAGNOSIS — R262 Difficulty in walking, not elsewhere classified: Secondary | ICD-10-CM | POA: Diagnosis not present

## 2015-11-12 DIAGNOSIS — R238 Other skin changes: Secondary | ICD-10-CM | POA: Diagnosis not present

## 2015-11-12 DIAGNOSIS — E079 Disorder of thyroid, unspecified: Secondary | ICD-10-CM | POA: Diagnosis not present

## 2015-11-12 DIAGNOSIS — G479 Sleep disorder, unspecified: Secondary | ICD-10-CM | POA: Diagnosis not present

## 2015-11-12 DIAGNOSIS — F329 Major depressive disorder, single episode, unspecified: Secondary | ICD-10-CM | POA: Diagnosis not present

## 2015-11-12 DIAGNOSIS — H9190 Unspecified hearing loss, unspecified ear: Secondary | ICD-10-CM | POA: Diagnosis not present

## 2015-11-12 DIAGNOSIS — R5383 Other fatigue: Secondary | ICD-10-CM | POA: Diagnosis not present

## 2015-11-12 DIAGNOSIS — Z9889 Other specified postprocedural states: Secondary | ICD-10-CM | POA: Diagnosis not present

## 2015-11-12 DIAGNOSIS — Z79899 Other long term (current) drug therapy: Secondary | ICD-10-CM | POA: Diagnosis not present

## 2015-11-12 DIAGNOSIS — K582 Mixed irritable bowel syndrome: Secondary | ICD-10-CM | POA: Diagnosis not present

## 2015-11-12 DIAGNOSIS — R110 Nausea: Secondary | ICD-10-CM | POA: Diagnosis not present

## 2015-11-12 DIAGNOSIS — Z9641 Presence of insulin pump (external) (internal): Secondary | ICD-10-CM | POA: Diagnosis not present

## 2015-11-12 DIAGNOSIS — F17210 Nicotine dependence, cigarettes, uncomplicated: Secondary | ICD-10-CM | POA: Diagnosis not present

## 2015-11-12 DIAGNOSIS — R200 Anesthesia of skin: Secondary | ICD-10-CM | POA: Diagnosis not present

## 2015-11-12 DIAGNOSIS — R531 Weakness: Secondary | ICD-10-CM | POA: Diagnosis not present

## 2015-11-12 DIAGNOSIS — R12 Heartburn: Secondary | ICD-10-CM | POA: Diagnosis not present

## 2015-11-14 DIAGNOSIS — F3131 Bipolar disorder, current episode depressed, mild: Secondary | ICD-10-CM | POA: Diagnosis not present

## 2015-11-14 DIAGNOSIS — F4312 Post-traumatic stress disorder, chronic: Secondary | ICD-10-CM | POA: Diagnosis not present

## 2015-11-21 DIAGNOSIS — F4312 Post-traumatic stress disorder, chronic: Secondary | ICD-10-CM | POA: Diagnosis not present

## 2015-11-21 DIAGNOSIS — F3131 Bipolar disorder, current episode depressed, mild: Secondary | ICD-10-CM | POA: Diagnosis not present

## 2015-11-27 DIAGNOSIS — F4312 Post-traumatic stress disorder, chronic: Secondary | ICD-10-CM | POA: Diagnosis not present

## 2015-11-27 DIAGNOSIS — F3131 Bipolar disorder, current episode depressed, mild: Secondary | ICD-10-CM | POA: Diagnosis not present

## 2015-12-01 DIAGNOSIS — R197 Diarrhea, unspecified: Secondary | ICD-10-CM | POA: Diagnosis not present

## 2015-12-01 DIAGNOSIS — R112 Nausea with vomiting, unspecified: Secondary | ICD-10-CM | POA: Diagnosis not present

## 2015-12-01 DIAGNOSIS — K219 Gastro-esophageal reflux disease without esophagitis: Secondary | ICD-10-CM | POA: Diagnosis not present

## 2015-12-01 DIAGNOSIS — R110 Nausea: Secondary | ICD-10-CM | POA: Diagnosis not present

## 2015-12-09 DIAGNOSIS — R110 Nausea: Secondary | ICD-10-CM | POA: Diagnosis not present

## 2015-12-09 DIAGNOSIS — K30 Functional dyspepsia: Secondary | ICD-10-CM | POA: Diagnosis not present

## 2015-12-12 DIAGNOSIS — F3131 Bipolar disorder, current episode depressed, mild: Secondary | ICD-10-CM | POA: Diagnosis not present

## 2015-12-12 DIAGNOSIS — F4312 Post-traumatic stress disorder, chronic: Secondary | ICD-10-CM | POA: Diagnosis not present

## 2015-12-18 DIAGNOSIS — E039 Hypothyroidism, unspecified: Secondary | ICD-10-CM | POA: Diagnosis not present

## 2015-12-18 DIAGNOSIS — Z9641 Presence of insulin pump (external) (internal): Secondary | ICD-10-CM | POA: Diagnosis not present

## 2015-12-18 DIAGNOSIS — Z6820 Body mass index (BMI) 20.0-20.9, adult: Secondary | ICD-10-CM | POA: Diagnosis not present

## 2015-12-18 DIAGNOSIS — F329 Major depressive disorder, single episode, unspecified: Secondary | ICD-10-CM | POA: Diagnosis not present

## 2015-12-18 DIAGNOSIS — E109 Type 1 diabetes mellitus without complications: Secondary | ICD-10-CM | POA: Diagnosis not present

## 2015-12-18 DIAGNOSIS — R110 Nausea: Secondary | ICD-10-CM | POA: Diagnosis not present

## 2015-12-18 DIAGNOSIS — K219 Gastro-esophageal reflux disease without esophagitis: Secondary | ICD-10-CM | POA: Diagnosis not present

## 2015-12-18 DIAGNOSIS — Z888 Allergy status to other drugs, medicaments and biological substances status: Secondary | ICD-10-CM | POA: Diagnosis not present

## 2015-12-18 DIAGNOSIS — Z79899 Other long term (current) drug therapy: Secondary | ICD-10-CM | POA: Diagnosis not present

## 2015-12-18 DIAGNOSIS — K3184 Gastroparesis: Secondary | ICD-10-CM | POA: Diagnosis not present

## 2015-12-18 DIAGNOSIS — F17210 Nicotine dependence, cigarettes, uncomplicated: Secondary | ICD-10-CM | POA: Diagnosis not present

## 2015-12-18 DIAGNOSIS — K5904 Chronic idiopathic constipation: Secondary | ICD-10-CM | POA: Diagnosis not present

## 2015-12-18 DIAGNOSIS — Z885 Allergy status to narcotic agent status: Secondary | ICD-10-CM | POA: Diagnosis not present

## 2015-12-18 DIAGNOSIS — Z794 Long term (current) use of insulin: Secondary | ICD-10-CM | POA: Diagnosis not present

## 2015-12-18 DIAGNOSIS — R630 Anorexia: Secondary | ICD-10-CM | POA: Diagnosis not present

## 2015-12-19 DIAGNOSIS — F4312 Post-traumatic stress disorder, chronic: Secondary | ICD-10-CM | POA: Diagnosis not present

## 2015-12-19 DIAGNOSIS — F3131 Bipolar disorder, current episode depressed, mild: Secondary | ICD-10-CM | POA: Diagnosis not present

## 2015-12-23 DIAGNOSIS — K5900 Constipation, unspecified: Secondary | ICD-10-CM | POA: Diagnosis not present

## 2015-12-23 DIAGNOSIS — R634 Abnormal weight loss: Secondary | ICD-10-CM | POA: Diagnosis not present

## 2015-12-23 DIAGNOSIS — R140 Abdominal distension (gaseous): Secondary | ICD-10-CM | POA: Diagnosis not present

## 2015-12-23 DIAGNOSIS — R110 Nausea: Secondary | ICD-10-CM | POA: Diagnosis not present

## 2015-12-23 DIAGNOSIS — R143 Flatulence: Secondary | ICD-10-CM | POA: Diagnosis not present

## 2015-12-23 DIAGNOSIS — E119 Type 2 diabetes mellitus without complications: Secondary | ICD-10-CM | POA: Diagnosis not present

## 2015-12-26 DIAGNOSIS — F3131 Bipolar disorder, current episode depressed, mild: Secondary | ICD-10-CM | POA: Diagnosis not present

## 2015-12-26 DIAGNOSIS — F4312 Post-traumatic stress disorder, chronic: Secondary | ICD-10-CM | POA: Diagnosis not present

## 2016-01-02 DIAGNOSIS — F4312 Post-traumatic stress disorder, chronic: Secondary | ICD-10-CM | POA: Diagnosis not present

## 2016-01-02 DIAGNOSIS — F3131 Bipolar disorder, current episode depressed, mild: Secondary | ICD-10-CM | POA: Diagnosis not present

## 2016-01-09 DIAGNOSIS — F3131 Bipolar disorder, current episode depressed, mild: Secondary | ICD-10-CM | POA: Diagnosis not present

## 2016-01-09 DIAGNOSIS — F4312 Post-traumatic stress disorder, chronic: Secondary | ICD-10-CM | POA: Diagnosis not present

## 2016-01-14 DIAGNOSIS — K5900 Constipation, unspecified: Secondary | ICD-10-CM | POA: Diagnosis not present

## 2016-01-14 DIAGNOSIS — K3184 Gastroparesis: Secondary | ICD-10-CM | POA: Diagnosis not present

## 2016-01-14 DIAGNOSIS — Z794 Long term (current) use of insulin: Secondary | ICD-10-CM | POA: Diagnosis not present

## 2016-01-14 DIAGNOSIS — Z79899 Other long term (current) drug therapy: Secondary | ICD-10-CM | POA: Diagnosis not present

## 2016-01-14 DIAGNOSIS — F319 Bipolar disorder, unspecified: Secondary | ICD-10-CM | POA: Diagnosis not present

## 2016-01-14 DIAGNOSIS — K589 Irritable bowel syndrome without diarrhea: Secondary | ICD-10-CM | POA: Diagnosis not present

## 2016-01-14 DIAGNOSIS — E1043 Type 1 diabetes mellitus with diabetic autonomic (poly)neuropathy: Secondary | ICD-10-CM | POA: Diagnosis not present

## 2016-01-16 DIAGNOSIS — F3131 Bipolar disorder, current episode depressed, mild: Secondary | ICD-10-CM | POA: Diagnosis not present

## 2016-01-16 DIAGNOSIS — F4312 Post-traumatic stress disorder, chronic: Secondary | ICD-10-CM | POA: Diagnosis not present

## 2016-01-23 DIAGNOSIS — E109 Type 1 diabetes mellitus without complications: Secondary | ICD-10-CM | POA: Diagnosis not present

## 2016-01-23 DIAGNOSIS — F3131 Bipolar disorder, current episode depressed, mild: Secondary | ICD-10-CM | POA: Diagnosis not present

## 2016-01-23 DIAGNOSIS — E118 Type 2 diabetes mellitus with unspecified complications: Secondary | ICD-10-CM | POA: Diagnosis not present

## 2016-01-23 DIAGNOSIS — I10 Essential (primary) hypertension: Secondary | ICD-10-CM | POA: Diagnosis not present

## 2016-01-23 DIAGNOSIS — F4312 Post-traumatic stress disorder, chronic: Secondary | ICD-10-CM | POA: Diagnosis not present

## 2016-01-27 DIAGNOSIS — E109 Type 1 diabetes mellitus without complications: Secondary | ICD-10-CM | POA: Diagnosis not present

## 2016-01-27 DIAGNOSIS — E789 Disorder of lipoprotein metabolism, unspecified: Secondary | ICD-10-CM | POA: Diagnosis not present

## 2016-01-27 DIAGNOSIS — E118 Type 2 diabetes mellitus with unspecified complications: Secondary | ICD-10-CM | POA: Diagnosis not present

## 2016-01-27 DIAGNOSIS — K3184 Gastroparesis: Secondary | ICD-10-CM | POA: Diagnosis not present

## 2016-01-30 DIAGNOSIS — E109 Type 1 diabetes mellitus without complications: Secondary | ICD-10-CM | POA: Diagnosis not present

## 2016-01-30 DIAGNOSIS — E039 Hypothyroidism, unspecified: Secondary | ICD-10-CM | POA: Diagnosis not present

## 2016-01-30 DIAGNOSIS — Z9641 Presence of insulin pump (external) (internal): Secondary | ICD-10-CM | POA: Diagnosis not present

## 2016-01-30 DIAGNOSIS — I10 Essential (primary) hypertension: Secondary | ICD-10-CM | POA: Diagnosis not present

## 2016-02-06 DIAGNOSIS — F4312 Post-traumatic stress disorder, chronic: Secondary | ICD-10-CM | POA: Diagnosis not present

## 2016-02-06 DIAGNOSIS — F3131 Bipolar disorder, current episode depressed, mild: Secondary | ICD-10-CM | POA: Diagnosis not present

## 2016-02-10 DIAGNOSIS — F3131 Bipolar disorder, current episode depressed, mild: Secondary | ICD-10-CM | POA: Diagnosis not present

## 2016-02-20 DIAGNOSIS — F3131 Bipolar disorder, current episode depressed, mild: Secondary | ICD-10-CM | POA: Diagnosis not present

## 2016-02-20 DIAGNOSIS — F4312 Post-traumatic stress disorder, chronic: Secondary | ICD-10-CM | POA: Diagnosis not present

## 2016-03-12 DIAGNOSIS — F3131 Bipolar disorder, current episode depressed, mild: Secondary | ICD-10-CM | POA: Diagnosis not present

## 2016-03-12 DIAGNOSIS — F4312 Post-traumatic stress disorder, chronic: Secondary | ICD-10-CM | POA: Diagnosis not present

## 2016-03-17 DIAGNOSIS — E1143 Type 2 diabetes mellitus with diabetic autonomic (poly)neuropathy: Secondary | ICD-10-CM | POA: Diagnosis not present

## 2016-03-17 DIAGNOSIS — R112 Nausea with vomiting, unspecified: Secondary | ICD-10-CM | POA: Diagnosis not present

## 2016-03-17 DIAGNOSIS — K3184 Gastroparesis: Secondary | ICD-10-CM | POA: Diagnosis not present

## 2016-03-17 DIAGNOSIS — K5900 Constipation, unspecified: Secondary | ICD-10-CM | POA: Diagnosis not present

## 2016-03-17 DIAGNOSIS — Z794 Long term (current) use of insulin: Secondary | ICD-10-CM | POA: Diagnosis not present

## 2016-03-17 DIAGNOSIS — Z79899 Other long term (current) drug therapy: Secondary | ICD-10-CM | POA: Diagnosis not present

## 2016-03-26 DIAGNOSIS — F3131 Bipolar disorder, current episode depressed, mild: Secondary | ICD-10-CM | POA: Diagnosis not present

## 2016-03-26 DIAGNOSIS — F4312 Post-traumatic stress disorder, chronic: Secondary | ICD-10-CM | POA: Diagnosis not present

## 2016-04-02 DIAGNOSIS — F4312 Post-traumatic stress disorder, chronic: Secondary | ICD-10-CM | POA: Diagnosis not present

## 2016-04-02 DIAGNOSIS — F3131 Bipolar disorder, current episode depressed, mild: Secondary | ICD-10-CM | POA: Diagnosis not present

## 2016-04-07 DIAGNOSIS — Z006 Encounter for examination for normal comparison and control in clinical research program: Secondary | ICD-10-CM | POA: Diagnosis not present

## 2016-04-07 DIAGNOSIS — K3184 Gastroparesis: Secondary | ICD-10-CM | POA: Diagnosis not present

## 2016-05-04 DIAGNOSIS — E109 Type 1 diabetes mellitus without complications: Secondary | ICD-10-CM | POA: Diagnosis not present

## 2016-05-04 DIAGNOSIS — Z23 Encounter for immunization: Secondary | ICD-10-CM | POA: Diagnosis not present

## 2016-05-04 DIAGNOSIS — E039 Hypothyroidism, unspecified: Secondary | ICD-10-CM | POA: Diagnosis not present

## 2016-05-04 DIAGNOSIS — I10 Essential (primary) hypertension: Secondary | ICD-10-CM | POA: Diagnosis not present

## 2016-05-04 DIAGNOSIS — Z9641 Presence of insulin pump (external) (internal): Secondary | ICD-10-CM | POA: Diagnosis not present

## 2016-05-07 DIAGNOSIS — E109 Type 1 diabetes mellitus without complications: Secondary | ICD-10-CM | POA: Diagnosis not present

## 2016-05-20 DIAGNOSIS — Z885 Allergy status to narcotic agent status: Secondary | ICD-10-CM | POA: Diagnosis not present

## 2016-05-20 DIAGNOSIS — Z888 Allergy status to other drugs, medicaments and biological substances status: Secondary | ICD-10-CM | POA: Diagnosis not present

## 2016-05-20 DIAGNOSIS — R12 Heartburn: Secondary | ICD-10-CM | POA: Diagnosis not present

## 2016-05-20 DIAGNOSIS — F329 Major depressive disorder, single episode, unspecified: Secondary | ICD-10-CM | POA: Diagnosis not present

## 2016-05-20 DIAGNOSIS — Z886 Allergy status to analgesic agent status: Secondary | ICD-10-CM | POA: Diagnosis not present

## 2016-05-20 DIAGNOSIS — R110 Nausea: Secondary | ICD-10-CM | POA: Diagnosis not present

## 2016-05-20 DIAGNOSIS — K3184 Gastroparesis: Secondary | ICD-10-CM | POA: Diagnosis not present

## 2016-05-20 DIAGNOSIS — E039 Hypothyroidism, unspecified: Secondary | ICD-10-CM | POA: Diagnosis not present

## 2016-05-20 DIAGNOSIS — E1043 Type 1 diabetes mellitus with diabetic autonomic (poly)neuropathy: Secondary | ICD-10-CM | POA: Diagnosis not present

## 2016-05-20 DIAGNOSIS — F17210 Nicotine dependence, cigarettes, uncomplicated: Secondary | ICD-10-CM | POA: Diagnosis not present

## 2016-05-20 DIAGNOSIS — K219 Gastro-esophageal reflux disease without esophagitis: Secondary | ICD-10-CM | POA: Diagnosis not present

## 2016-05-25 DIAGNOSIS — I10 Essential (primary) hypertension: Secondary | ICD-10-CM | POA: Diagnosis not present

## 2016-05-25 DIAGNOSIS — E756 Lipid storage disorder, unspecified: Secondary | ICD-10-CM | POA: Diagnosis not present

## 2016-05-25 DIAGNOSIS — E109 Type 1 diabetes mellitus without complications: Secondary | ICD-10-CM | POA: Diagnosis not present

## 2016-06-01 DIAGNOSIS — K3184 Gastroparesis: Secondary | ICD-10-CM | POA: Diagnosis not present

## 2016-06-01 DIAGNOSIS — E789 Disorder of lipoprotein metabolism, unspecified: Secondary | ICD-10-CM | POA: Diagnosis not present

## 2016-06-01 DIAGNOSIS — E118 Type 2 diabetes mellitus with unspecified complications: Secondary | ICD-10-CM | POA: Diagnosis not present

## 2016-06-01 DIAGNOSIS — E032 Hypothyroidism due to medicaments and other exogenous substances: Secondary | ICD-10-CM | POA: Diagnosis not present

## 2016-08-10 DIAGNOSIS — F3175 Bipolar disorder, in partial remission, most recent episode depressed: Secondary | ICD-10-CM | POA: Diagnosis not present

## 2016-08-12 DIAGNOSIS — Z9641 Presence of insulin pump (external) (internal): Secondary | ICD-10-CM | POA: Diagnosis not present

## 2016-08-12 DIAGNOSIS — E039 Hypothyroidism, unspecified: Secondary | ICD-10-CM | POA: Diagnosis not present

## 2016-08-12 DIAGNOSIS — I10 Essential (primary) hypertension: Secondary | ICD-10-CM | POA: Diagnosis not present

## 2016-08-12 DIAGNOSIS — E109 Type 1 diabetes mellitus without complications: Secondary | ICD-10-CM | POA: Diagnosis not present

## 2016-10-29 DIAGNOSIS — E109 Type 1 diabetes mellitus without complications: Secondary | ICD-10-CM | POA: Diagnosis not present

## 2016-10-29 DIAGNOSIS — Z79899 Other long term (current) drug therapy: Secondary | ICD-10-CM | POA: Diagnosis not present

## 2016-10-29 DIAGNOSIS — E039 Hypothyroidism, unspecified: Secondary | ICD-10-CM | POA: Diagnosis not present

## 2016-10-29 DIAGNOSIS — I10 Essential (primary) hypertension: Secondary | ICD-10-CM | POA: Diagnosis not present

## 2016-11-03 DIAGNOSIS — E039 Hypothyroidism, unspecified: Secondary | ICD-10-CM | POA: Diagnosis not present

## 2016-11-03 DIAGNOSIS — I10 Essential (primary) hypertension: Secondary | ICD-10-CM | POA: Diagnosis not present

## 2016-11-03 DIAGNOSIS — E7800 Pure hypercholesterolemia, unspecified: Secondary | ICD-10-CM | POA: Diagnosis not present

## 2016-11-03 DIAGNOSIS — E109 Type 1 diabetes mellitus without complications: Secondary | ICD-10-CM | POA: Diagnosis not present

## 2016-11-04 DIAGNOSIS — E032 Hypothyroidism due to medicaments and other exogenous substances: Secondary | ICD-10-CM | POA: Diagnosis not present

## 2016-11-04 DIAGNOSIS — Z23 Encounter for immunization: Secondary | ICD-10-CM | POA: Diagnosis not present

## 2016-11-04 DIAGNOSIS — R918 Other nonspecific abnormal finding of lung field: Secondary | ICD-10-CM | POA: Diagnosis not present

## 2016-11-04 DIAGNOSIS — E109 Type 1 diabetes mellitus without complications: Secondary | ICD-10-CM | POA: Diagnosis not present

## 2016-11-04 DIAGNOSIS — Z87891 Personal history of nicotine dependence: Secondary | ICD-10-CM | POA: Diagnosis not present

## 2016-11-04 DIAGNOSIS — E789 Disorder of lipoprotein metabolism, unspecified: Secondary | ICD-10-CM | POA: Diagnosis not present

## 2016-11-25 DIAGNOSIS — D225 Melanocytic nevi of trunk: Secondary | ICD-10-CM | POA: Diagnosis not present

## 2016-11-25 DIAGNOSIS — L821 Other seborrheic keratosis: Secondary | ICD-10-CM | POA: Diagnosis not present

## 2016-11-25 DIAGNOSIS — L820 Inflamed seborrheic keratosis: Secondary | ICD-10-CM | POA: Diagnosis not present

## 2016-11-25 DIAGNOSIS — L814 Other melanin hyperpigmentation: Secondary | ICD-10-CM | POA: Diagnosis not present

## 2016-11-25 DIAGNOSIS — L932 Other local lupus erythematosus: Secondary | ICD-10-CM | POA: Diagnosis not present

## 2017-01-05 DIAGNOSIS — E109 Type 1 diabetes mellitus without complications: Secondary | ICD-10-CM | POA: Diagnosis not present

## 2017-01-11 DIAGNOSIS — E032 Hypothyroidism due to medicaments and other exogenous substances: Secondary | ICD-10-CM | POA: Diagnosis not present

## 2017-01-11 DIAGNOSIS — E118 Type 2 diabetes mellitus with unspecified complications: Secondary | ICD-10-CM | POA: Diagnosis not present

## 2017-01-11 DIAGNOSIS — E789 Disorder of lipoprotein metabolism, unspecified: Secondary | ICD-10-CM | POA: Diagnosis not present

## 2017-01-13 DIAGNOSIS — L820 Inflamed seborrheic keratosis: Secondary | ICD-10-CM | POA: Diagnosis not present

## 2017-01-13 DIAGNOSIS — L930 Discoid lupus erythematosus: Secondary | ICD-10-CM | POA: Diagnosis not present

## 2017-02-02 DIAGNOSIS — F3176 Bipolar disorder, in full remission, most recent episode depressed: Secondary | ICD-10-CM | POA: Diagnosis not present

## 2017-02-02 DIAGNOSIS — F4312 Post-traumatic stress disorder, chronic: Secondary | ICD-10-CM | POA: Diagnosis not present

## 2017-02-02 DIAGNOSIS — F410 Panic disorder [episodic paroxysmal anxiety] without agoraphobia: Secondary | ICD-10-CM | POA: Diagnosis not present

## 2017-02-10 DIAGNOSIS — E109 Type 1 diabetes mellitus without complications: Secondary | ICD-10-CM | POA: Diagnosis not present

## 2017-02-10 DIAGNOSIS — E7800 Pure hypercholesterolemia, unspecified: Secondary | ICD-10-CM | POA: Diagnosis not present

## 2017-02-10 DIAGNOSIS — L932 Other local lupus erythematosus: Secondary | ICD-10-CM | POA: Diagnosis not present

## 2017-02-10 DIAGNOSIS — E039 Hypothyroidism, unspecified: Secondary | ICD-10-CM | POA: Diagnosis not present

## 2017-02-10 DIAGNOSIS — I10 Essential (primary) hypertension: Secondary | ICD-10-CM | POA: Diagnosis not present

## 2017-05-02 DIAGNOSIS — E119 Type 2 diabetes mellitus without complications: Secondary | ICD-10-CM | POA: Diagnosis not present

## 2017-05-02 DIAGNOSIS — Z79899 Other long term (current) drug therapy: Secondary | ICD-10-CM | POA: Diagnosis not present

## 2017-05-02 DIAGNOSIS — E7800 Pure hypercholesterolemia, unspecified: Secondary | ICD-10-CM | POA: Diagnosis not present

## 2017-05-09 DIAGNOSIS — D72829 Elevated white blood cell count, unspecified: Secondary | ICD-10-CM | POA: Diagnosis not present

## 2017-05-09 DIAGNOSIS — E7800 Pure hypercholesterolemia, unspecified: Secondary | ICD-10-CM | POA: Diagnosis not present

## 2017-05-09 DIAGNOSIS — E109 Type 1 diabetes mellitus without complications: Secondary | ICD-10-CM | POA: Diagnosis not present

## 2017-05-09 DIAGNOSIS — Z23 Encounter for immunization: Secondary | ICD-10-CM | POA: Diagnosis not present

## 2017-05-09 DIAGNOSIS — E039 Hypothyroidism, unspecified: Secondary | ICD-10-CM | POA: Diagnosis not present

## 2017-06-06 DIAGNOSIS — K3184 Gastroparesis: Secondary | ICD-10-CM | POA: Diagnosis not present

## 2017-06-06 DIAGNOSIS — E039 Hypothyroidism, unspecified: Secondary | ICD-10-CM | POA: Diagnosis not present

## 2017-06-06 DIAGNOSIS — I10 Essential (primary) hypertension: Secondary | ICD-10-CM | POA: Diagnosis not present

## 2017-06-06 DIAGNOSIS — Z9641 Presence of insulin pump (external) (internal): Secondary | ICD-10-CM | POA: Diagnosis not present

## 2017-06-06 DIAGNOSIS — E109 Type 1 diabetes mellitus without complications: Secondary | ICD-10-CM | POA: Diagnosis not present

## 2017-06-06 DIAGNOSIS — D72829 Elevated white blood cell count, unspecified: Secondary | ICD-10-CM | POA: Diagnosis not present

## 2017-06-06 DIAGNOSIS — E7800 Pure hypercholesterolemia, unspecified: Secondary | ICD-10-CM | POA: Diagnosis not present

## 2017-06-13 DIAGNOSIS — E039 Hypothyroidism, unspecified: Secondary | ICD-10-CM | POA: Diagnosis not present

## 2017-06-13 DIAGNOSIS — D72829 Elevated white blood cell count, unspecified: Secondary | ICD-10-CM | POA: Diagnosis not present

## 2017-06-13 DIAGNOSIS — E109 Type 1 diabetes mellitus without complications: Secondary | ICD-10-CM | POA: Diagnosis not present

## 2017-06-13 DIAGNOSIS — N390 Urinary tract infection, site not specified: Secondary | ICD-10-CM | POA: Diagnosis not present

## 2017-06-15 DIAGNOSIS — K3184 Gastroparesis: Secondary | ICD-10-CM | POA: Diagnosis not present

## 2017-06-15 DIAGNOSIS — E109 Type 1 diabetes mellitus without complications: Secondary | ICD-10-CM | POA: Diagnosis not present

## 2017-06-15 DIAGNOSIS — R140 Abdominal distension (gaseous): Secondary | ICD-10-CM | POA: Diagnosis not present

## 2017-06-15 DIAGNOSIS — R112 Nausea with vomiting, unspecified: Secondary | ICD-10-CM | POA: Diagnosis not present

## 2017-07-01 DIAGNOSIS — D72829 Elevated white blood cell count, unspecified: Secondary | ICD-10-CM | POA: Diagnosis not present

## 2017-08-15 DIAGNOSIS — F3176 Bipolar disorder, in full remission, most recent episode depressed: Secondary | ICD-10-CM | POA: Diagnosis not present

## 2017-08-15 DIAGNOSIS — F410 Panic disorder [episodic paroxysmal anxiety] without agoraphobia: Secondary | ICD-10-CM | POA: Diagnosis not present

## 2017-09-06 DIAGNOSIS — Z9641 Presence of insulin pump (external) (internal): Secondary | ICD-10-CM | POA: Diagnosis not present

## 2017-09-06 DIAGNOSIS — E109 Type 1 diabetes mellitus without complications: Secondary | ICD-10-CM | POA: Diagnosis not present

## 2017-09-06 DIAGNOSIS — I10 Essential (primary) hypertension: Secondary | ICD-10-CM | POA: Diagnosis not present

## 2017-09-06 DIAGNOSIS — E039 Hypothyroidism, unspecified: Secondary | ICD-10-CM | POA: Diagnosis not present

## 2017-11-01 DIAGNOSIS — E789 Disorder of lipoprotein metabolism, unspecified: Secondary | ICD-10-CM | POA: Diagnosis not present

## 2017-11-01 DIAGNOSIS — N390 Urinary tract infection, site not specified: Secondary | ICD-10-CM | POA: Diagnosis not present

## 2017-11-01 DIAGNOSIS — E039 Hypothyroidism, unspecified: Secondary | ICD-10-CM | POA: Diagnosis not present

## 2017-11-01 DIAGNOSIS — E7800 Pure hypercholesterolemia, unspecified: Secondary | ICD-10-CM | POA: Diagnosis not present

## 2017-11-01 DIAGNOSIS — E109 Type 1 diabetes mellitus without complications: Secondary | ICD-10-CM | POA: Diagnosis not present

## 2017-11-08 DIAGNOSIS — S8991XA Unspecified injury of right lower leg, initial encounter: Secondary | ICD-10-CM | POA: Diagnosis not present

## 2017-11-08 DIAGNOSIS — E7800 Pure hypercholesterolemia, unspecified: Secondary | ICD-10-CM | POA: Diagnosis not present

## 2017-11-08 DIAGNOSIS — S79911A Unspecified injury of right hip, initial encounter: Secondary | ICD-10-CM | POA: Diagnosis not present

## 2017-11-08 DIAGNOSIS — M79644 Pain in right finger(s): Secondary | ICD-10-CM | POA: Diagnosis not present

## 2017-11-08 DIAGNOSIS — Z0000 Encounter for general adult medical examination without abnormal findings: Secondary | ICD-10-CM | POA: Diagnosis not present

## 2017-11-08 DIAGNOSIS — M25461 Effusion, right knee: Secondary | ICD-10-CM | POA: Diagnosis not present

## 2017-11-08 DIAGNOSIS — S6991XA Unspecified injury of right wrist, hand and finger(s), initial encounter: Secondary | ICD-10-CM | POA: Diagnosis not present

## 2017-11-08 DIAGNOSIS — Z930 Tracheostomy status: Secondary | ICD-10-CM | POA: Diagnosis not present

## 2017-11-08 DIAGNOSIS — Y999 Unspecified external cause status: Secondary | ICD-10-CM | POA: Diagnosis not present

## 2017-11-08 DIAGNOSIS — W01198A Fall on same level from slipping, tripping and stumbling with subsequent striking against other object, initial encounter: Secondary | ICD-10-CM | POA: Diagnosis not present

## 2017-11-08 DIAGNOSIS — E039 Hypothyroidism, unspecified: Secondary | ICD-10-CM | POA: Diagnosis not present

## 2017-11-08 DIAGNOSIS — Z8249 Family history of ischemic heart disease and other diseases of the circulatory system: Secondary | ICD-10-CM | POA: Diagnosis not present

## 2017-11-08 DIAGNOSIS — S82121A Displaced fracture of lateral condyle of right tibia, initial encounter for closed fracture: Secondary | ICD-10-CM | POA: Diagnosis not present

## 2017-11-08 DIAGNOSIS — K219 Gastro-esophageal reflux disease without esophagitis: Secondary | ICD-10-CM | POA: Diagnosis not present

## 2017-11-08 DIAGNOSIS — Z841 Family history of disorders of kidney and ureter: Secondary | ICD-10-CM | POA: Diagnosis not present

## 2017-11-08 DIAGNOSIS — M25551 Pain in right hip: Secondary | ICD-10-CM | POA: Diagnosis not present

## 2017-11-08 DIAGNOSIS — Z01419 Encounter for gynecological examination (general) (routine) without abnormal findings: Secondary | ICD-10-CM | POA: Diagnosis not present

## 2017-11-08 DIAGNOSIS — T148XXA Other injury of unspecified body region, initial encounter: Secondary | ICD-10-CM | POA: Diagnosis not present

## 2017-11-08 DIAGNOSIS — S82141A Displaced bicondylar fracture of right tibia, initial encounter for closed fracture: Secondary | ICD-10-CM | POA: Diagnosis not present

## 2017-11-08 DIAGNOSIS — M25541 Pain in joints of right hand: Secondary | ICD-10-CM | POA: Diagnosis not present

## 2017-11-08 DIAGNOSIS — Z794 Long term (current) use of insulin: Secondary | ICD-10-CM | POA: Diagnosis not present

## 2017-11-08 DIAGNOSIS — Z1212 Encounter for screening for malignant neoplasm of rectum: Secondary | ICD-10-CM | POA: Diagnosis not present

## 2017-11-08 DIAGNOSIS — M25561 Pain in right knee: Secondary | ICD-10-CM | POA: Diagnosis not present

## 2017-11-08 DIAGNOSIS — S60051A Contusion of right little finger without damage to nail, initial encounter: Secondary | ICD-10-CM | POA: Diagnosis not present

## 2017-11-09 DIAGNOSIS — M25551 Pain in right hip: Secondary | ICD-10-CM | POA: Diagnosis not present

## 2017-11-09 DIAGNOSIS — M6281 Muscle weakness (generalized): Secondary | ICD-10-CM | POA: Diagnosis not present

## 2017-11-09 DIAGNOSIS — F419 Anxiety disorder, unspecified: Secondary | ICD-10-CM | POA: Diagnosis not present

## 2017-11-09 DIAGNOSIS — S82121A Displaced fracture of lateral condyle of right tibia, initial encounter for closed fracture: Secondary | ICD-10-CM | POA: Diagnosis not present

## 2017-11-09 DIAGNOSIS — Z1151 Encounter for screening for human papillomavirus (HPV): Secondary | ICD-10-CM | POA: Diagnosis not present

## 2017-11-09 DIAGNOSIS — E108 Type 1 diabetes mellitus with unspecified complications: Secondary | ICD-10-CM | POA: Diagnosis not present

## 2017-11-09 DIAGNOSIS — M2540 Effusion, unspecified joint: Secondary | ICD-10-CM | POA: Diagnosis not present

## 2017-11-09 DIAGNOSIS — Z8639 Personal history of other endocrine, nutritional and metabolic disease: Secondary | ICD-10-CM | POA: Diagnosis not present

## 2017-11-09 DIAGNOSIS — E785 Hyperlipidemia, unspecified: Secondary | ICD-10-CM | POA: Diagnosis not present

## 2017-11-09 DIAGNOSIS — W19XXXA Unspecified fall, initial encounter: Secondary | ICD-10-CM | POA: Diagnosis not present

## 2017-11-09 DIAGNOSIS — K3184 Gastroparesis: Secondary | ICD-10-CM | POA: Diagnosis present

## 2017-11-09 DIAGNOSIS — S82291D Other fracture of shaft of right tibia, subsequent encounter for closed fracture with routine healing: Secondary | ICD-10-CM | POA: Diagnosis not present

## 2017-11-09 DIAGNOSIS — Z930 Tracheostomy status: Secondary | ICD-10-CM | POA: Diagnosis not present

## 2017-11-09 DIAGNOSIS — R279 Unspecified lack of coordination: Secondary | ICD-10-CM | POA: Diagnosis not present

## 2017-11-09 DIAGNOSIS — E039 Hypothyroidism, unspecified: Secondary | ICD-10-CM | POA: Diagnosis not present

## 2017-11-09 DIAGNOSIS — Z87891 Personal history of nicotine dependence: Secondary | ICD-10-CM | POA: Diagnosis not present

## 2017-11-09 DIAGNOSIS — Z9641 Presence of insulin pump (external) (internal): Secondary | ICD-10-CM | POA: Diagnosis not present

## 2017-11-09 DIAGNOSIS — Z9181 History of falling: Secondary | ICD-10-CM | POA: Diagnosis not present

## 2017-11-09 DIAGNOSIS — Z743 Need for continuous supervision: Secondary | ICD-10-CM | POA: Diagnosis not present

## 2017-11-09 DIAGNOSIS — E1043 Type 1 diabetes mellitus with diabetic autonomic (poly)neuropathy: Secondary | ICD-10-CM | POA: Diagnosis present

## 2017-11-09 DIAGNOSIS — G8918 Other acute postprocedural pain: Secondary | ICD-10-CM | POA: Diagnosis not present

## 2017-11-09 DIAGNOSIS — Z794 Long term (current) use of insulin: Secondary | ICD-10-CM | POA: Diagnosis not present

## 2017-11-09 DIAGNOSIS — R488 Other symbolic dysfunctions: Secondary | ICD-10-CM | POA: Diagnosis not present

## 2017-11-09 DIAGNOSIS — Z609 Problem related to social environment, unspecified: Secondary | ICD-10-CM | POA: Diagnosis not present

## 2017-11-09 DIAGNOSIS — Z01419 Encounter for gynecological examination (general) (routine) without abnormal findings: Secondary | ICD-10-CM | POA: Diagnosis not present

## 2017-11-09 DIAGNOSIS — Z841 Family history of disorders of kidney and ureter: Secondary | ICD-10-CM | POA: Diagnosis not present

## 2017-11-09 DIAGNOSIS — M25461 Effusion, right knee: Secondary | ICD-10-CM | POA: Diagnosis not present

## 2017-11-09 DIAGNOSIS — F329 Major depressive disorder, single episode, unspecified: Secondary | ICD-10-CM | POA: Diagnosis not present

## 2017-11-09 DIAGNOSIS — Z8249 Family history of ischemic heart disease and other diseases of the circulatory system: Secondary | ICD-10-CM | POA: Diagnosis not present

## 2017-11-09 DIAGNOSIS — S82141A Displaced bicondylar fracture of right tibia, initial encounter for closed fracture: Secondary | ICD-10-CM | POA: Diagnosis not present

## 2017-11-09 DIAGNOSIS — R52 Pain, unspecified: Secondary | ICD-10-CM | POA: Diagnosis not present

## 2017-11-09 DIAGNOSIS — S82141D Displaced bicondylar fracture of right tibia, subsequent encounter for closed fracture with routine healing: Secondary | ICD-10-CM | POA: Diagnosis not present

## 2017-11-09 LAB — BASIC METABOLIC PANEL
BUN: 19 (ref 4–21)
Creatinine: 0.7 (ref 0.5–1.1)
Glucose: 185
Potassium: 3.8 (ref 3.4–5.3)
Sodium: 139 (ref 137–147)

## 2017-11-09 LAB — CBC AND DIFFERENTIAL
HCT: 41 (ref 36–46)
Hemoglobin: 13.8 (ref 12.0–16.0)
Platelets: 239 (ref 150–399)
WBC: 14.1

## 2017-11-17 DIAGNOSIS — S82141A Displaced bicondylar fracture of right tibia, initial encounter for closed fracture: Secondary | ICD-10-CM | POA: Diagnosis not present

## 2017-11-17 DIAGNOSIS — F319 Bipolar disorder, unspecified: Secondary | ICD-10-CM | POA: Diagnosis not present

## 2017-11-17 DIAGNOSIS — E785 Hyperlipidemia, unspecified: Secondary | ICD-10-CM | POA: Diagnosis not present

## 2017-11-17 DIAGNOSIS — I10 Essential (primary) hypertension: Secondary | ICD-10-CM | POA: Diagnosis not present

## 2017-11-17 DIAGNOSIS — Z743 Need for continuous supervision: Secondary | ICD-10-CM | POA: Diagnosis not present

## 2017-11-17 DIAGNOSIS — Z9641 Presence of insulin pump (external) (internal): Secondary | ICD-10-CM | POA: Diagnosis not present

## 2017-11-17 DIAGNOSIS — R488 Other symbolic dysfunctions: Secondary | ICD-10-CM | POA: Diagnosis not present

## 2017-11-17 DIAGNOSIS — R279 Unspecified lack of coordination: Secondary | ICD-10-CM | POA: Diagnosis not present

## 2017-11-17 DIAGNOSIS — F329 Major depressive disorder, single episode, unspecified: Secondary | ICD-10-CM | POA: Diagnosis not present

## 2017-11-17 DIAGNOSIS — E034 Atrophy of thyroid (acquired): Secondary | ICD-10-CM | POA: Diagnosis not present

## 2017-11-17 DIAGNOSIS — S82141D Displaced bicondylar fracture of right tibia, subsequent encounter for closed fracture with routine healing: Secondary | ICD-10-CM | POA: Diagnosis not present

## 2017-11-17 DIAGNOSIS — E109 Type 1 diabetes mellitus without complications: Secondary | ICD-10-CM | POA: Diagnosis not present

## 2017-11-17 DIAGNOSIS — F419 Anxiety disorder, unspecified: Secondary | ICD-10-CM | POA: Diagnosis not present

## 2017-11-17 DIAGNOSIS — Z9181 History of falling: Secondary | ICD-10-CM | POA: Diagnosis not present

## 2017-11-17 DIAGNOSIS — M6281 Muscle weakness (generalized): Secondary | ICD-10-CM | POA: Diagnosis not present

## 2017-11-17 DIAGNOSIS — E1022 Type 1 diabetes mellitus with diabetic chronic kidney disease: Secondary | ICD-10-CM | POA: Diagnosis not present

## 2017-11-17 DIAGNOSIS — N182 Chronic kidney disease, stage 2 (mild): Secondary | ICD-10-CM | POA: Diagnosis not present

## 2017-11-17 DIAGNOSIS — E1069 Type 1 diabetes mellitus with other specified complication: Secondary | ICD-10-CM | POA: Diagnosis not present

## 2017-11-17 DIAGNOSIS — R52 Pain, unspecified: Secondary | ICD-10-CM | POA: Diagnosis not present

## 2017-11-17 DIAGNOSIS — E039 Hypothyroidism, unspecified: Secondary | ICD-10-CM | POA: Diagnosis not present

## 2017-11-17 DIAGNOSIS — Z609 Problem related to social environment, unspecified: Secondary | ICD-10-CM | POA: Diagnosis not present

## 2017-11-17 DIAGNOSIS — W19XXXA Unspecified fall, initial encounter: Secondary | ICD-10-CM | POA: Diagnosis not present

## 2017-11-17 DIAGNOSIS — E1043 Type 1 diabetes mellitus with diabetic autonomic (poly)neuropathy: Secondary | ICD-10-CM | POA: Diagnosis not present

## 2017-11-17 DIAGNOSIS — E108 Type 1 diabetes mellitus with unspecified complications: Secondary | ICD-10-CM | POA: Diagnosis not present

## 2017-11-17 DIAGNOSIS — M25461 Effusion, right knee: Secondary | ICD-10-CM | POA: Diagnosis not present

## 2017-11-17 DIAGNOSIS — K3184 Gastroparesis: Secondary | ICD-10-CM | POA: Diagnosis not present

## 2017-11-18 ENCOUNTER — Non-Acute Institutional Stay (SKILLED_NURSING_FACILITY): Payer: Medicare Other | Admitting: Internal Medicine

## 2017-11-18 ENCOUNTER — Encounter: Payer: Self-pay | Admitting: Internal Medicine

## 2017-11-18 DIAGNOSIS — E785 Hyperlipidemia, unspecified: Secondary | ICD-10-CM | POA: Diagnosis not present

## 2017-11-18 DIAGNOSIS — E034 Atrophy of thyroid (acquired): Secondary | ICD-10-CM | POA: Diagnosis not present

## 2017-11-18 DIAGNOSIS — F319 Bipolar disorder, unspecified: Secondary | ICD-10-CM

## 2017-11-18 DIAGNOSIS — S82141D Displaced bicondylar fracture of right tibia, subsequent encounter for closed fracture with routine healing: Secondary | ICD-10-CM

## 2017-11-18 DIAGNOSIS — F329 Major depressive disorder, single episode, unspecified: Secondary | ICD-10-CM

## 2017-11-18 DIAGNOSIS — E1022 Type 1 diabetes mellitus with diabetic chronic kidney disease: Secondary | ICD-10-CM

## 2017-11-18 DIAGNOSIS — E1069 Type 1 diabetes mellitus with other specified complication: Secondary | ICD-10-CM | POA: Diagnosis not present

## 2017-11-18 DIAGNOSIS — F32A Depression, unspecified: Secondary | ICD-10-CM

## 2017-11-18 DIAGNOSIS — F419 Anxiety disorder, unspecified: Secondary | ICD-10-CM | POA: Diagnosis not present

## 2017-11-18 DIAGNOSIS — N182 Chronic kidney disease, stage 2 (mild): Secondary | ICD-10-CM | POA: Diagnosis not present
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
## 2017-11-20 ENCOUNTER — Encounter: Payer: Self-pay | Admitting: Internal Medicine

## 2017-11-20 DIAGNOSIS — S82141A Displaced bicondylar fracture of right tibia, initial encounter for closed fracture: Secondary | ICD-10-CM | POA: Insufficient documentation

## 2017-11-20 DIAGNOSIS — F419 Anxiety disorder, unspecified: Secondary | ICD-10-CM | POA: Insufficient documentation

## 2017-11-20 DIAGNOSIS — E039 Hypothyroidism, unspecified: Secondary | ICD-10-CM | POA: Insufficient documentation

## 2017-12-05 DIAGNOSIS — S82141D Displaced bicondylar fracture of right tibia, subsequent encounter for closed fracture with routine healing: Secondary | ICD-10-CM | POA: Diagnosis not present

## 2017-12-06 DIAGNOSIS — E109 Type 1 diabetes mellitus without complications: Secondary | ICD-10-CM | POA: Diagnosis not present

## 2017-12-06 DIAGNOSIS — I10 Essential (primary) hypertension: Secondary | ICD-10-CM | POA: Diagnosis not present

## 2017-12-06 DIAGNOSIS — Z9641 Presence of insulin pump (external) (internal): Secondary | ICD-10-CM | POA: Diagnosis not present

## 2017-12-06 DIAGNOSIS — E039 Hypothyroidism, unspecified: Secondary | ICD-10-CM | POA: Diagnosis not present

## 2017-12-09 ENCOUNTER — Non-Acute Institutional Stay (SKILLED_NURSING_FACILITY): Payer: Medicare Other | Admitting: Internal Medicine

## 2017-12-09 ENCOUNTER — Encounter: Payer: Self-pay | Admitting: Internal Medicine

## 2017-12-09 DIAGNOSIS — N182 Chronic kidney disease, stage 2 (mild): Secondary | ICD-10-CM

## 2017-12-09 DIAGNOSIS — E034 Atrophy of thyroid (acquired): Secondary | ICD-10-CM

## 2017-12-09 DIAGNOSIS — F419 Anxiety disorder, unspecified: Secondary | ICD-10-CM | POA: Diagnosis not present

## 2017-12-09 DIAGNOSIS — E1022 Type 1 diabetes mellitus with diabetic chronic kidney disease: Secondary | ICD-10-CM

## 2017-12-09 DIAGNOSIS — E1069 Type 1 diabetes mellitus with other specified complication: Secondary | ICD-10-CM

## 2017-12-09 DIAGNOSIS — E785 Hyperlipidemia, unspecified: Secondary | ICD-10-CM | POA: Diagnosis not present

## 2017-12-09 DIAGNOSIS — S82141D Displaced bicondylar fracture of right tibia, subsequent encounter for closed fracture with routine healing: Secondary | ICD-10-CM | POA: Diagnosis not present

## 2017-12-09 DIAGNOSIS — F329 Major depressive disorder, single episode, unspecified: Secondary | ICD-10-CM

## 2017-12-09 DIAGNOSIS — F32A Depression, unspecified: Secondary | ICD-10-CM
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
## 2017-12-14 DIAGNOSIS — K3184 Gastroparesis: Secondary | ICD-10-CM | POA: Diagnosis not present

## 2017-12-14 DIAGNOSIS — M4802 Spinal stenosis, cervical region: Secondary | ICD-10-CM | POA: Diagnosis not present

## 2017-12-14 DIAGNOSIS — E1040 Type 1 diabetes mellitus with diabetic neuropathy, unspecified: Secondary | ICD-10-CM | POA: Diagnosis not present

## 2017-12-14 DIAGNOSIS — E10319 Type 1 diabetes mellitus with unspecified diabetic retinopathy without macular edema: Secondary | ICD-10-CM | POA: Diagnosis not present

## 2017-12-14 DIAGNOSIS — M47812 Spondylosis without myelopathy or radiculopathy, cervical region: Secondary | ICD-10-CM | POA: Diagnosis not present

## 2017-12-14 DIAGNOSIS — S82141D Displaced bicondylar fracture of right tibia, subsequent encounter for closed fracture with routine healing: Secondary | ICD-10-CM | POA: Diagnosis not present

## 2017-12-14 DIAGNOSIS — F419 Anxiety disorder, unspecified: Secondary | ICD-10-CM | POA: Diagnosis not present

## 2017-12-14 DIAGNOSIS — E1043 Type 1 diabetes mellitus with diabetic autonomic (poly)neuropathy: Secondary | ICD-10-CM | POA: Diagnosis not present

## 2017-12-16 DIAGNOSIS — E1040 Type 1 diabetes mellitus with diabetic neuropathy, unspecified: Secondary | ICD-10-CM | POA: Diagnosis not present

## 2017-12-16 DIAGNOSIS — M4802 Spinal stenosis, cervical region: Secondary | ICD-10-CM | POA: Diagnosis not present

## 2017-12-16 DIAGNOSIS — M47812 Spondylosis without myelopathy or radiculopathy, cervical region: Secondary | ICD-10-CM | POA: Diagnosis not present

## 2017-12-16 DIAGNOSIS — K3184 Gastroparesis: Secondary | ICD-10-CM | POA: Diagnosis not present

## 2017-12-16 DIAGNOSIS — S82141D Displaced bicondylar fracture of right tibia, subsequent encounter for closed fracture with routine healing: Secondary | ICD-10-CM | POA: Diagnosis not present

## 2017-12-16 DIAGNOSIS — E1043 Type 1 diabetes mellitus with diabetic autonomic (poly)neuropathy: Secondary | ICD-10-CM | POA: Diagnosis not present

## 2017-12-23 DIAGNOSIS — K3184 Gastroparesis: Secondary | ICD-10-CM | POA: Diagnosis not present

## 2017-12-23 DIAGNOSIS — M47812 Spondylosis without myelopathy or radiculopathy, cervical region: Secondary | ICD-10-CM | POA: Diagnosis not present

## 2017-12-23 DIAGNOSIS — E1040 Type 1 diabetes mellitus with diabetic neuropathy, unspecified: Secondary | ICD-10-CM | POA: Diagnosis not present

## 2017-12-23 DIAGNOSIS — S82141D Displaced bicondylar fracture of right tibia, subsequent encounter for closed fracture with routine healing: Secondary | ICD-10-CM | POA: Diagnosis not present

## 2017-12-23 DIAGNOSIS — E1043 Type 1 diabetes mellitus with diabetic autonomic (poly)neuropathy: Secondary | ICD-10-CM | POA: Diagnosis not present

## 2017-12-23 DIAGNOSIS — M4802 Spinal stenosis, cervical region: Secondary | ICD-10-CM | POA: Diagnosis not present

## 2017-12-27 DIAGNOSIS — E1040 Type 1 diabetes mellitus with diabetic neuropathy, unspecified: Secondary | ICD-10-CM | POA: Diagnosis not present

## 2017-12-27 DIAGNOSIS — M47812 Spondylosis without myelopathy or radiculopathy, cervical region: Secondary | ICD-10-CM | POA: Diagnosis not present

## 2017-12-27 DIAGNOSIS — S82141D Displaced bicondylar fracture of right tibia, subsequent encounter for closed fracture with routine healing: Secondary | ICD-10-CM | POA: Diagnosis not present

## 2017-12-27 DIAGNOSIS — E1043 Type 1 diabetes mellitus with diabetic autonomic (poly)neuropathy: Secondary | ICD-10-CM | POA: Diagnosis not present

## 2017-12-27 DIAGNOSIS — K3184 Gastroparesis: Secondary | ICD-10-CM | POA: Diagnosis not present

## 2017-12-27 DIAGNOSIS — M4802 Spinal stenosis, cervical region: Secondary | ICD-10-CM | POA: Diagnosis not present

## 2017-12-29 DIAGNOSIS — S82141D Displaced bicondylar fracture of right tibia, subsequent encounter for closed fracture with routine healing: Secondary | ICD-10-CM | POA: Diagnosis not present

## 2017-12-30 ENCOUNTER — Telehealth: Payer: Self-pay | Admitting: *Deleted

## 2017-12-30 DIAGNOSIS — M47812 Spondylosis without myelopathy or radiculopathy, cervical region: Secondary | ICD-10-CM | POA: Diagnosis not present

## 2017-12-30 DIAGNOSIS — E1043 Type 1 diabetes mellitus with diabetic autonomic (poly)neuropathy: Secondary | ICD-10-CM | POA: Diagnosis not present

## 2017-12-30 DIAGNOSIS — M4802 Spinal stenosis, cervical region: Secondary | ICD-10-CM | POA: Diagnosis not present

## 2017-12-30 DIAGNOSIS — K3184 Gastroparesis: Secondary | ICD-10-CM | POA: Diagnosis not present

## 2017-12-30 DIAGNOSIS — S82141D Displaced bicondylar fracture of right tibia, subsequent encounter for closed fracture with routine healing: Secondary | ICD-10-CM | POA: Diagnosis not present

## 2017-12-30 DIAGNOSIS — E1040 Type 1 diabetes mellitus with diabetic neuropathy, unspecified: Secondary | ICD-10-CM | POA: Diagnosis not present

## 2017-12-30 NOTE — Telephone Encounter (Signed)
Sonia Baller with [facility] called requesting orders. Informed her that this is not our patient and that she would need to contact patient's PCP.

## 2018-01-03 DIAGNOSIS — S82141D Displaced bicondylar fracture of right tibia, subsequent encounter for closed fracture with routine healing: Secondary | ICD-10-CM | POA: Diagnosis not present

## 2018-01-03 DIAGNOSIS — E1043 Type 1 diabetes mellitus with diabetic autonomic (poly)neuropathy: Secondary | ICD-10-CM | POA: Diagnosis not present

## 2018-01-03 DIAGNOSIS — E1040 Type 1 diabetes mellitus with diabetic neuropathy, unspecified: Secondary | ICD-10-CM | POA: Diagnosis not present

## 2018-01-03 DIAGNOSIS — M4802 Spinal stenosis, cervical region: Secondary | ICD-10-CM | POA: Diagnosis not present

## 2018-01-03 DIAGNOSIS — K3184 Gastroparesis: Secondary | ICD-10-CM | POA: Diagnosis not present

## 2018-01-03 DIAGNOSIS — M47812 Spondylosis without myelopathy or radiculopathy, cervical region: Secondary | ICD-10-CM | POA: Diagnosis not present

## 2018-01-05 DIAGNOSIS — E1043 Type 1 diabetes mellitus with diabetic autonomic (poly)neuropathy: Secondary | ICD-10-CM | POA: Diagnosis not present

## 2018-01-05 DIAGNOSIS — M47812 Spondylosis without myelopathy or radiculopathy, cervical region: Secondary | ICD-10-CM | POA: Diagnosis not present

## 2018-01-05 DIAGNOSIS — M4802 Spinal stenosis, cervical region: Secondary | ICD-10-CM | POA: Diagnosis not present

## 2018-01-05 DIAGNOSIS — S82141D Displaced bicondylar fracture of right tibia, subsequent encounter for closed fracture with routine healing: Secondary | ICD-10-CM | POA: Diagnosis not present

## 2018-01-05 DIAGNOSIS — E1040 Type 1 diabetes mellitus with diabetic neuropathy, unspecified: Secondary | ICD-10-CM | POA: Diagnosis not present

## 2018-01-05 DIAGNOSIS — K3184 Gastroparesis: Secondary | ICD-10-CM | POA: Diagnosis not present

## 2018-01-08 ENCOUNTER — Other Ambulatory Visit: Payer: Self-pay | Admitting: Internal Medicine

## 2018-01-09 ENCOUNTER — Other Ambulatory Visit: Payer: Self-pay | Admitting: Internal Medicine

## 2018-01-10 DIAGNOSIS — K3184 Gastroparesis: Secondary | ICD-10-CM | POA: Diagnosis not present

## 2018-01-10 DIAGNOSIS — S82141D Displaced bicondylar fracture of right tibia, subsequent encounter for closed fracture with routine healing: Secondary | ICD-10-CM | POA: Diagnosis not present

## 2018-01-10 DIAGNOSIS — M4802 Spinal stenosis, cervical region: Secondary | ICD-10-CM | POA: Diagnosis not present

## 2018-01-10 DIAGNOSIS — E1040 Type 1 diabetes mellitus with diabetic neuropathy, unspecified: Secondary | ICD-10-CM | POA: Diagnosis not present

## 2018-01-10 DIAGNOSIS — E1043 Type 1 diabetes mellitus with diabetic autonomic (poly)neuropathy: Secondary | ICD-10-CM | POA: Diagnosis not present

## 2018-01-10 DIAGNOSIS — M47812 Spondylosis without myelopathy or radiculopathy, cervical region: Secondary | ICD-10-CM | POA: Diagnosis not present

## 2018-01-11 DIAGNOSIS — S82141D Displaced bicondylar fracture of right tibia, subsequent encounter for closed fracture with routine healing: Secondary | ICD-10-CM | POA: Diagnosis not present

## 2018-01-11 DIAGNOSIS — M4802 Spinal stenosis, cervical region: Secondary | ICD-10-CM | POA: Diagnosis not present

## 2018-01-11 DIAGNOSIS — E1040 Type 1 diabetes mellitus with diabetic neuropathy, unspecified: Secondary | ICD-10-CM | POA: Diagnosis not present

## 2018-01-11 DIAGNOSIS — K3184 Gastroparesis: Secondary | ICD-10-CM | POA: Diagnosis not present

## 2018-01-11 DIAGNOSIS — E1043 Type 1 diabetes mellitus with diabetic autonomic (poly)neuropathy: Secondary | ICD-10-CM | POA: Diagnosis not present

## 2018-01-11 DIAGNOSIS — M47812 Spondylosis without myelopathy or radiculopathy, cervical region: Secondary | ICD-10-CM | POA: Diagnosis not present

## 2018-01-12 DIAGNOSIS — M47812 Spondylosis without myelopathy or radiculopathy, cervical region: Secondary | ICD-10-CM | POA: Diagnosis not present

## 2018-01-12 DIAGNOSIS — E559 Vitamin D deficiency, unspecified: Secondary | ICD-10-CM | POA: Diagnosis not present

## 2018-01-12 DIAGNOSIS — K3184 Gastroparesis: Secondary | ICD-10-CM | POA: Diagnosis not present

## 2018-01-12 DIAGNOSIS — E1043 Type 1 diabetes mellitus with diabetic autonomic (poly)neuropathy: Secondary | ICD-10-CM | POA: Diagnosis not present

## 2018-01-12 DIAGNOSIS — Z87311 Personal history of (healed) other pathological fracture: Secondary | ICD-10-CM | POA: Diagnosis not present

## 2018-01-12 DIAGNOSIS — Z8639 Personal history of other endocrine, nutritional and metabolic disease: Secondary | ICD-10-CM | POA: Diagnosis not present

## 2018-01-12 DIAGNOSIS — E1040 Type 1 diabetes mellitus with diabetic neuropathy, unspecified: Secondary | ICD-10-CM | POA: Diagnosis not present

## 2018-01-12 DIAGNOSIS — Z7189 Other specified counseling: Secondary | ICD-10-CM | POA: Diagnosis not present

## 2018-01-12 DIAGNOSIS — M4802 Spinal stenosis, cervical region: Secondary | ICD-10-CM | POA: Diagnosis not present

## 2018-01-12 DIAGNOSIS — Z7409 Other reduced mobility: Secondary | ICD-10-CM | POA: Diagnosis not present

## 2018-01-12 DIAGNOSIS — S82141D Displaced bicondylar fracture of right tibia, subsequent encounter for closed fracture with routine healing: Secondary | ICD-10-CM | POA: Diagnosis not present

## 2018-01-12 DIAGNOSIS — M810 Age-related osteoporosis without current pathological fracture: Secondary | ICD-10-CM | POA: Diagnosis not present

## 2018-01-16 DIAGNOSIS — S82141D Displaced bicondylar fracture of right tibia, subsequent encounter for closed fracture with routine healing: Secondary | ICD-10-CM | POA: Diagnosis not present

## 2018-01-16 DIAGNOSIS — E1040 Type 1 diabetes mellitus with diabetic neuropathy, unspecified: Secondary | ICD-10-CM | POA: Diagnosis not present

## 2018-01-16 DIAGNOSIS — E1043 Type 1 diabetes mellitus with diabetic autonomic (poly)neuropathy: Secondary | ICD-10-CM | POA: Diagnosis not present

## 2018-01-16 DIAGNOSIS — M47812 Spondylosis without myelopathy or radiculopathy, cervical region: Secondary | ICD-10-CM | POA: Diagnosis not present

## 2018-01-16 DIAGNOSIS — M4802 Spinal stenosis, cervical region: Secondary | ICD-10-CM | POA: Diagnosis not present

## 2018-01-16 DIAGNOSIS — K3184 Gastroparesis: Secondary | ICD-10-CM | POA: Diagnosis not present

## 2018-01-27 DIAGNOSIS — E7800 Pure hypercholesterolemia, unspecified: Secondary | ICD-10-CM | POA: Diagnosis not present

## 2018-01-27 DIAGNOSIS — Z9641 Presence of insulin pump (external) (internal): Secondary | ICD-10-CM | POA: Diagnosis not present

## 2018-01-27 DIAGNOSIS — E039 Hypothyroidism, unspecified: Secondary | ICD-10-CM | POA: Diagnosis not present

## 2018-01-27 DIAGNOSIS — E1140 Type 2 diabetes mellitus with diabetic neuropathy, unspecified: Secondary | ICD-10-CM | POA: Diagnosis not present

## 2018-01-27 DIAGNOSIS — I10 Essential (primary) hypertension: Secondary | ICD-10-CM | POA: Diagnosis not present

## 2018-01-27 DIAGNOSIS — K3184 Gastroparesis: Secondary | ICD-10-CM | POA: Diagnosis not present

## 2018-01-27 DIAGNOSIS — E109 Type 1 diabetes mellitus without complications: Secondary | ICD-10-CM | POA: Diagnosis not present

## 2018-02-09 DIAGNOSIS — E559 Vitamin D deficiency, unspecified: Secondary | ICD-10-CM | POA: Diagnosis not present

## 2018-02-09 DIAGNOSIS — Z7189 Other specified counseling: Secondary | ICD-10-CM | POA: Diagnosis not present

## 2018-02-09 DIAGNOSIS — M8588 Other specified disorders of bone density and structure, other site: Secondary | ICD-10-CM | POA: Diagnosis not present

## 2018-02-09 DIAGNOSIS — Z87311 Personal history of (healed) other pathological fracture: Secondary | ICD-10-CM | POA: Diagnosis not present

## 2018-02-09 DIAGNOSIS — Z780 Asymptomatic menopausal state: Secondary | ICD-10-CM | POA: Diagnosis not present

## 2018-02-09 DIAGNOSIS — M810 Age-related osteoporosis without current pathological fracture: Secondary | ICD-10-CM | POA: Diagnosis not present

## 2018-02-16 DIAGNOSIS — S82141D Displaced bicondylar fracture of right tibia, subsequent encounter for closed fracture with routine healing: Secondary | ICD-10-CM | POA: Diagnosis not present

## 2018-02-16 DIAGNOSIS — Z4789 Encounter for other orthopedic aftercare: Secondary | ICD-10-CM | POA: Diagnosis not present

## 2018-02-16 DIAGNOSIS — M25561 Pain in right knee: Secondary | ICD-10-CM | POA: Diagnosis not present

## 2018-03-01 DIAGNOSIS — F4312 Post-traumatic stress disorder, chronic: Secondary | ICD-10-CM | POA: Diagnosis not present

## 2018-03-01 DIAGNOSIS — F4321 Adjustment disorder with depressed mood: Secondary | ICD-10-CM | POA: Diagnosis not present

## 2018-03-01 DIAGNOSIS — F410 Panic disorder [episodic paroxysmal anxiety] without agoraphobia: Secondary | ICD-10-CM | POA: Diagnosis not present

## 2018-03-03 DIAGNOSIS — M25661 Stiffness of right knee, not elsewhere classified: Secondary | ICD-10-CM | POA: Diagnosis not present

## 2018-03-03 DIAGNOSIS — R29898 Other symptoms and signs involving the musculoskeletal system: Secondary | ICD-10-CM | POA: Diagnosis not present

## 2018-03-06 DIAGNOSIS — M25661 Stiffness of right knee, not elsewhere classified: Secondary | ICD-10-CM | POA: Diagnosis not present

## 2018-03-06 DIAGNOSIS — R29898 Other symptoms and signs involving the musculoskeletal system: Secondary | ICD-10-CM | POA: Diagnosis not present

## 2018-03-09 DIAGNOSIS — M25661 Stiffness of right knee, not elsewhere classified: Secondary | ICD-10-CM | POA: Diagnosis not present

## 2018-03-09 DIAGNOSIS — R29898 Other symptoms and signs involving the musculoskeletal system: Secondary | ICD-10-CM | POA: Diagnosis not present

## 2018-03-14 DIAGNOSIS — M25661 Stiffness of right knee, not elsewhere classified: Secondary | ICD-10-CM | POA: Diagnosis not present

## 2018-03-14 DIAGNOSIS — R29898 Other symptoms and signs involving the musculoskeletal system: Secondary | ICD-10-CM | POA: Diagnosis not present

## 2018-03-15 DIAGNOSIS — F3131 Bipolar disorder, current episode depressed, mild: Secondary | ICD-10-CM | POA: Diagnosis not present

## 2018-03-29 DIAGNOSIS — F3131 Bipolar disorder, current episode depressed, mild: Secondary | ICD-10-CM | POA: Diagnosis not present

## 2018-05-02 DIAGNOSIS — K3184 Gastroparesis: Secondary | ICD-10-CM | POA: Diagnosis not present

## 2018-05-02 DIAGNOSIS — Z23 Encounter for immunization: Secondary | ICD-10-CM | POA: Diagnosis not present

## 2018-05-02 DIAGNOSIS — E109 Type 1 diabetes mellitus without complications: Secondary | ICD-10-CM | POA: Diagnosis not present

## 2018-05-02 DIAGNOSIS — E559 Vitamin D deficiency, unspecified: Secondary | ICD-10-CM | POA: Diagnosis not present

## 2018-05-02 DIAGNOSIS — Z9641 Presence of insulin pump (external) (internal): Secondary | ICD-10-CM | POA: Diagnosis not present

## 2018-05-02 DIAGNOSIS — E039 Hypothyroidism, unspecified: Secondary | ICD-10-CM | POA: Diagnosis not present

## 2018-05-02 DIAGNOSIS — I10 Essential (primary) hypertension: Secondary | ICD-10-CM | POA: Diagnosis not present

## 2018-05-02 DIAGNOSIS — E7800 Pure hypercholesterolemia, unspecified: Secondary | ICD-10-CM | POA: Diagnosis not present

## 2018-05-05 DIAGNOSIS — E039 Hypothyroidism, unspecified: Secondary | ICD-10-CM | POA: Diagnosis not present

## 2018-05-05 DIAGNOSIS — E559 Vitamin D deficiency, unspecified: Secondary | ICD-10-CM | POA: Diagnosis not present

## 2018-05-05 DIAGNOSIS — E109 Type 1 diabetes mellitus without complications: Secondary | ICD-10-CM | POA: Diagnosis not present

## 2018-05-05 DIAGNOSIS — I10 Essential (primary) hypertension: Secondary | ICD-10-CM | POA: Diagnosis not present

## 2018-05-05 DIAGNOSIS — E7800 Pure hypercholesterolemia, unspecified: Secondary | ICD-10-CM | POA: Diagnosis not present

## 2018-05-10 DIAGNOSIS — K3184 Gastroparesis: Secondary | ICD-10-CM | POA: Diagnosis not present

## 2018-05-10 DIAGNOSIS — E7800 Pure hypercholesterolemia, unspecified: Secondary | ICD-10-CM | POA: Diagnosis not present

## 2018-05-10 DIAGNOSIS — K219 Gastro-esophageal reflux disease without esophagitis: Secondary | ICD-10-CM | POA: Diagnosis not present

## 2018-05-10 DIAGNOSIS — E039 Hypothyroidism, unspecified: Secondary | ICD-10-CM | POA: Diagnosis not present

## 2018-05-10 DIAGNOSIS — E559 Vitamin D deficiency, unspecified: Secondary | ICD-10-CM | POA: Diagnosis not present

## 2018-05-10 DIAGNOSIS — M810 Age-related osteoporosis without current pathological fracture: Secondary | ICD-10-CM | POA: Diagnosis not present

## 2018-05-22 DIAGNOSIS — S82141D Displaced bicondylar fracture of right tibia, subsequent encounter for closed fracture with routine healing: Secondary | ICD-10-CM | POA: Diagnosis not present

## 2018-05-25 DIAGNOSIS — M810 Age-related osteoporosis without current pathological fracture: Secondary | ICD-10-CM | POA: Diagnosis not present

## 2018-06-27 ENCOUNTER — Other Ambulatory Visit: Payer: Self-pay | Admitting: Internal Medicine

## 2018-08-01 DIAGNOSIS — I10 Essential (primary) hypertension: Secondary | ICD-10-CM | POA: Diagnosis not present

## 2018-08-01 DIAGNOSIS — Z9641 Presence of insulin pump (external) (internal): Secondary | ICD-10-CM | POA: Diagnosis not present

## 2018-08-01 DIAGNOSIS — E039 Hypothyroidism, unspecified: Secondary | ICD-10-CM | POA: Diagnosis not present

## 2018-08-01 DIAGNOSIS — E7800 Pure hypercholesterolemia, unspecified: Secondary | ICD-10-CM | POA: Diagnosis not present

## 2018-08-01 DIAGNOSIS — E109 Type 1 diabetes mellitus without complications: Secondary | ICD-10-CM | POA: Diagnosis not present

## 2018-08-09 DIAGNOSIS — E113553 Type 2 diabetes mellitus with stable proliferative diabetic retinopathy, bilateral: Secondary | ICD-10-CM | POA: Diagnosis not present

## 2018-08-09 DIAGNOSIS — H5015 Alternating exotropia: Secondary | ICD-10-CM | POA: Diagnosis not present

## 2018-08-09 DIAGNOSIS — H2513 Age-related nuclear cataract, bilateral: Secondary | ICD-10-CM | POA: Diagnosis not present

## 2018-09-28 DIAGNOSIS — H2512 Age-related nuclear cataract, left eye: Secondary | ICD-10-CM | POA: Diagnosis not present

## 2018-09-28 DIAGNOSIS — H2513 Age-related nuclear cataract, bilateral: Secondary | ICD-10-CM | POA: Diagnosis not present

## 2018-09-28 DIAGNOSIS — H18413 Arcus senilis, bilateral: Secondary | ICD-10-CM | POA: Diagnosis not present

## 2018-09-28 DIAGNOSIS — E103593 Type 1 diabetes mellitus with proliferative diabetic retinopathy without macular edema, bilateral: Secondary | ICD-10-CM | POA: Diagnosis not present

## 2018-09-28 DIAGNOSIS — H25013 Cortical age-related cataract, bilateral: Secondary | ICD-10-CM | POA: Diagnosis not present

## 2018-09-28 DIAGNOSIS — H25043 Posterior subcapsular polar age-related cataract, bilateral: Secondary | ICD-10-CM | POA: Diagnosis not present

## 2018-10-30 DIAGNOSIS — E039 Hypothyroidism, unspecified: Secondary | ICD-10-CM | POA: Diagnosis not present

## 2018-10-30 DIAGNOSIS — E109 Type 1 diabetes mellitus without complications: Secondary | ICD-10-CM | POA: Diagnosis not present

## 2018-10-30 DIAGNOSIS — E7800 Pure hypercholesterolemia, unspecified: Secondary | ICD-10-CM | POA: Diagnosis not present

## 2018-10-31 DIAGNOSIS — E7800 Pure hypercholesterolemia, unspecified: Secondary | ICD-10-CM | POA: Diagnosis not present

## 2018-10-31 DIAGNOSIS — K3184 Gastroparesis: Secondary | ICD-10-CM | POA: Diagnosis not present

## 2018-10-31 DIAGNOSIS — E109 Type 1 diabetes mellitus without complications: Secondary | ICD-10-CM | POA: Diagnosis not present

## 2018-10-31 DIAGNOSIS — E039 Hypothyroidism, unspecified: Secondary | ICD-10-CM | POA: Diagnosis not present

## 2018-10-31 DIAGNOSIS — E119 Type 2 diabetes mellitus without complications: Secondary | ICD-10-CM | POA: Diagnosis not present

## 2018-10-31 DIAGNOSIS — M810 Age-related osteoporosis without current pathological fracture: Secondary | ICD-10-CM | POA: Diagnosis not present

## 2018-10-31 DIAGNOSIS — E1140 Type 2 diabetes mellitus with diabetic neuropathy, unspecified: Secondary | ICD-10-CM | POA: Diagnosis not present

## 2018-10-31 DIAGNOSIS — I10 Essential (primary) hypertension: Secondary | ICD-10-CM | POA: Diagnosis not present

## 2018-10-31 DIAGNOSIS — Z9641 Presence of insulin pump (external) (internal): Secondary | ICD-10-CM | POA: Diagnosis not present

## 2018-10-31 DIAGNOSIS — L932 Other local lupus erythematosus: Secondary | ICD-10-CM | POA: Diagnosis not present

## 2018-10-31 DIAGNOSIS — E559 Vitamin D deficiency, unspecified: Secondary | ICD-10-CM | POA: Diagnosis not present

## 2018-11-20 DIAGNOSIS — S82141A Displaced bicondylar fracture of right tibia, initial encounter for closed fracture: Secondary | ICD-10-CM | POA: Diagnosis not present

## 2018-11-20 DIAGNOSIS — M5416 Radiculopathy, lumbar region: Secondary | ICD-10-CM | POA: Diagnosis not present

## 2018-12-04 DIAGNOSIS — Z87311 Personal history of (healed) other pathological fracture: Secondary | ICD-10-CM | POA: Diagnosis not present

## 2018-12-04 DIAGNOSIS — M545 Low back pain: Secondary | ICD-10-CM | POA: Diagnosis not present

## 2018-12-04 DIAGNOSIS — I739 Peripheral vascular disease, unspecified: Secondary | ICD-10-CM | POA: Diagnosis not present

## 2018-12-04 DIAGNOSIS — E108 Type 1 diabetes mellitus with unspecified complications: Secondary | ICD-10-CM | POA: Diagnosis not present

## 2018-12-04 DIAGNOSIS — S82141A Displaced bicondylar fracture of right tibia, initial encounter for closed fracture: Secondary | ICD-10-CM | POA: Diagnosis not present

## 2018-12-04 DIAGNOSIS — M5416 Radiculopathy, lumbar region: Secondary | ICD-10-CM | POA: Diagnosis not present

## 2018-12-04 DIAGNOSIS — M5136 Other intervertebral disc degeneration, lumbar region: Secondary | ICD-10-CM | POA: Diagnosis not present

## 2018-12-04 DIAGNOSIS — Z9641 Presence of insulin pump (external) (internal): Secondary | ICD-10-CM | POA: Diagnosis not present

## 2018-12-15 DIAGNOSIS — I739 Peripheral vascular disease, unspecified: Secondary | ICD-10-CM | POA: Diagnosis not present

## 2018-12-23 DIAGNOSIS — M5416 Radiculopathy, lumbar region: Secondary | ICD-10-CM | POA: Diagnosis not present

## 2018-12-23 DIAGNOSIS — M545 Low back pain: Secondary | ICD-10-CM | POA: Diagnosis not present

## 2018-12-23 DIAGNOSIS — I739 Peripheral vascular disease, unspecified: Secondary | ICD-10-CM | POA: Diagnosis not present

## 2018-12-27 DIAGNOSIS — H2512 Age-related nuclear cataract, left eye: Secondary | ICD-10-CM | POA: Diagnosis not present

## 2018-12-28 DIAGNOSIS — H2511 Age-related nuclear cataract, right eye: Secondary | ICD-10-CM | POA: Diagnosis not present

## 2019-01-01 DIAGNOSIS — E109 Type 1 diabetes mellitus without complications: Secondary | ICD-10-CM | POA: Diagnosis not present

## 2019-01-01 DIAGNOSIS — M5136 Other intervertebral disc degeneration, lumbar region: Secondary | ICD-10-CM | POA: Diagnosis not present

## 2019-01-01 DIAGNOSIS — M47816 Spondylosis without myelopathy or radiculopathy, lumbar region: Secondary | ICD-10-CM | POA: Diagnosis not present

## 2019-01-01 DIAGNOSIS — M5416 Radiculopathy, lumbar region: Secondary | ICD-10-CM | POA: Diagnosis not present

## 2019-01-24 DIAGNOSIS — E109 Type 1 diabetes mellitus without complications: Secondary | ICD-10-CM | POA: Diagnosis not present

## 2019-01-24 DIAGNOSIS — E7800 Pure hypercholesterolemia, unspecified: Secondary | ICD-10-CM | POA: Diagnosis not present

## 2019-01-30 DIAGNOSIS — E1140 Type 2 diabetes mellitus with diabetic neuropathy, unspecified: Secondary | ICD-10-CM | POA: Diagnosis not present

## 2019-01-30 DIAGNOSIS — Z9641 Presence of insulin pump (external) (internal): Secondary | ICD-10-CM | POA: Diagnosis not present

## 2019-01-30 DIAGNOSIS — E7800 Pure hypercholesterolemia, unspecified: Secondary | ICD-10-CM | POA: Diagnosis not present

## 2019-01-30 DIAGNOSIS — I10 Essential (primary) hypertension: Secondary | ICD-10-CM | POA: Diagnosis not present

## 2019-01-30 DIAGNOSIS — E109 Type 1 diabetes mellitus without complications: Secondary | ICD-10-CM | POA: Diagnosis not present

## 2019-01-30 DIAGNOSIS — E039 Hypothyroidism, unspecified: Secondary | ICD-10-CM | POA: Diagnosis not present

## 2019-02-20 DIAGNOSIS — E039 Hypothyroidism, unspecified: Secondary | ICD-10-CM | POA: Diagnosis not present

## 2019-02-20 DIAGNOSIS — E7800 Pure hypercholesterolemia, unspecified: Secondary | ICD-10-CM | POA: Diagnosis not present

## 2019-02-20 DIAGNOSIS — Z9641 Presence of insulin pump (external) (internal): Secondary | ICD-10-CM | POA: Diagnosis not present

## 2019-02-20 DIAGNOSIS — E109 Type 1 diabetes mellitus without complications: Secondary | ICD-10-CM | POA: Diagnosis not present

## 2019-02-20 DIAGNOSIS — I10 Essential (primary) hypertension: Secondary | ICD-10-CM | POA: Diagnosis not present

## 2019-02-28 DIAGNOSIS — M47896 Other spondylosis, lumbar region: Secondary | ICD-10-CM | POA: Diagnosis not present

## 2019-02-28 DIAGNOSIS — Z9641 Presence of insulin pump (external) (internal): Secondary | ICD-10-CM | POA: Diagnosis not present

## 2019-02-28 DIAGNOSIS — M5416 Radiculopathy, lumbar region: Secondary | ICD-10-CM | POA: Diagnosis not present

## 2019-02-28 DIAGNOSIS — M5136 Other intervertebral disc degeneration, lumbar region: Secondary | ICD-10-CM | POA: Diagnosis not present

## 2019-02-28 DIAGNOSIS — E108 Type 1 diabetes mellitus with unspecified complications: Secondary | ICD-10-CM | POA: Diagnosis not present

## 2019-03-14 DIAGNOSIS — H2511 Age-related nuclear cataract, right eye: Secondary | ICD-10-CM | POA: Diagnosis not present

## 2019-05-03 DIAGNOSIS — I10 Essential (primary) hypertension: Secondary | ICD-10-CM | POA: Diagnosis not present

## 2019-05-03 DIAGNOSIS — E7800 Pure hypercholesterolemia, unspecified: Secondary | ICD-10-CM | POA: Diagnosis not present

## 2019-05-03 DIAGNOSIS — E1140 Type 2 diabetes mellitus with diabetic neuropathy, unspecified: Secondary | ICD-10-CM | POA: Diagnosis not present

## 2019-05-03 DIAGNOSIS — Z23 Encounter for immunization: Secondary | ICD-10-CM | POA: Diagnosis not present

## 2019-05-03 DIAGNOSIS — E109 Type 1 diabetes mellitus without complications: Secondary | ICD-10-CM | POA: Diagnosis not present

## 2019-05-03 DIAGNOSIS — E039 Hypothyroidism, unspecified: Secondary | ICD-10-CM | POA: Diagnosis not present

## 2019-05-03 DIAGNOSIS — M810 Age-related osteoporosis without current pathological fracture: Secondary | ICD-10-CM | POA: Diagnosis not present

## 2019-05-03 DIAGNOSIS — K3184 Gastroparesis: Secondary | ICD-10-CM | POA: Diagnosis not present

## 2019-05-03 DIAGNOSIS — Z9641 Presence of insulin pump (external) (internal): Secondary | ICD-10-CM | POA: Diagnosis not present

## 2019-05-03 DIAGNOSIS — E559 Vitamin D deficiency, unspecified: Secondary | ICD-10-CM | POA: Diagnosis not present

## 2019-05-03 DIAGNOSIS — Z794 Long term (current) use of insulin: Secondary | ICD-10-CM | POA: Diagnosis not present

## 2019-05-09 DIAGNOSIS — I10 Essential (primary) hypertension: Secondary | ICD-10-CM | POA: Diagnosis not present

## 2019-05-09 DIAGNOSIS — E109 Type 1 diabetes mellitus without complications: Secondary | ICD-10-CM | POA: Diagnosis not present

## 2019-05-09 DIAGNOSIS — E039 Hypothyroidism, unspecified: Secondary | ICD-10-CM | POA: Diagnosis not present

## 2019-05-09 DIAGNOSIS — E559 Vitamin D deficiency, unspecified: Secondary | ICD-10-CM | POA: Diagnosis not present

## 2019-05-16 DIAGNOSIS — E109 Type 1 diabetes mellitus without complications: Secondary | ICD-10-CM | POA: Diagnosis not present

## 2019-05-16 DIAGNOSIS — E039 Hypothyroidism, unspecified: Secondary | ICD-10-CM | POA: Diagnosis not present

## 2019-05-16 DIAGNOSIS — F17200 Nicotine dependence, unspecified, uncomplicated: Secondary | ICD-10-CM | POA: Diagnosis not present

## 2019-05-16 DIAGNOSIS — Z0000 Encounter for general adult medical examination without abnormal findings: Secondary | ICD-10-CM | POA: Diagnosis not present

## 2019-05-16 DIAGNOSIS — K219 Gastro-esophageal reflux disease without esophagitis: Secondary | ICD-10-CM | POA: Diagnosis not present

## 2019-05-16 DIAGNOSIS — E7800 Pure hypercholesterolemia, unspecified: Secondary | ICD-10-CM | POA: Diagnosis not present

## 2019-05-22 ENCOUNTER — Other Ambulatory Visit: Payer: Self-pay | Admitting: Internal Medicine

## 2019-05-22 DIAGNOSIS — E559 Vitamin D deficiency, unspecified: Secondary | ICD-10-CM | POA: Diagnosis not present

## 2019-05-22 DIAGNOSIS — E109 Type 1 diabetes mellitus without complications: Secondary | ICD-10-CM | POA: Diagnosis not present

## 2019-05-22 DIAGNOSIS — F17200 Nicotine dependence, unspecified, uncomplicated: Secondary | ICD-10-CM

## 2019-05-22 DIAGNOSIS — E7800 Pure hypercholesterolemia, unspecified: Secondary | ICD-10-CM | POA: Diagnosis not present

## 2019-05-22 DIAGNOSIS — E039 Hypothyroidism, unspecified: Secondary | ICD-10-CM | POA: Diagnosis not present

## 2019-05-29 DIAGNOSIS — M810 Age-related osteoporosis without current pathological fracture: Secondary | ICD-10-CM | POA: Diagnosis not present

## 2019-06-05 ENCOUNTER — Other Ambulatory Visit: Payer: Self-pay | Admitting: Internal Medicine

## 2019-06-05 DIAGNOSIS — Z1231 Encounter for screening mammogram for malignant neoplasm of breast: Secondary | ICD-10-CM

## 2019-06-18 ENCOUNTER — Ambulatory Visit
Admission: RE | Admit: 2019-06-18 | Discharge: 2019-06-18 | Disposition: A | Discharge status: Home / Self Care | Payer: Medicare Other | Source: Ambulatory Visit | Attending: Internal Medicine | Admitting: Internal Medicine

## 2019-06-18 DIAGNOSIS — F17210 Nicotine dependence, cigarettes, uncomplicated: Secondary | ICD-10-CM | POA: Diagnosis not present

## 2019-06-18 DIAGNOSIS — F17200 Nicotine dependence, unspecified, uncomplicated: Secondary | ICD-10-CM

## 2019-07-31 ENCOUNTER — Ambulatory Visit: Payer: Medicare Other

## 2019-08-02 DIAGNOSIS — I10 Essential (primary) hypertension: Secondary | ICD-10-CM | POA: Diagnosis not present

## 2019-08-02 DIAGNOSIS — E1069 Type 1 diabetes mellitus with other specified complication: Secondary | ICD-10-CM | POA: Diagnosis not present

## 2019-08-02 DIAGNOSIS — E039 Hypothyroidism, unspecified: Secondary | ICD-10-CM | POA: Diagnosis not present

## 2019-08-02 DIAGNOSIS — E559 Vitamin D deficiency, unspecified: Secondary | ICD-10-CM | POA: Diagnosis not present

## 2019-08-02 DIAGNOSIS — E109 Type 1 diabetes mellitus without complications: Secondary | ICD-10-CM | POA: Diagnosis not present

## 2019-08-02 DIAGNOSIS — E7800 Pure hypercholesterolemia, unspecified: Secondary | ICD-10-CM | POA: Diagnosis not present

## 2019-08-02 DIAGNOSIS — Z9641 Presence of insulin pump (external) (internal): Secondary | ICD-10-CM | POA: Diagnosis not present

## 2019-09-03 ENCOUNTER — Other Ambulatory Visit: Payer: Self-pay

## 2019-09-03 ENCOUNTER — Ambulatory Visit
Admission: RE | Admit: 2019-09-03 | Discharge: 2019-09-03 | Disposition: A | Discharge status: Home / Self Care | Payer: Medicare Other | Source: Ambulatory Visit | Attending: Internal Medicine | Admitting: Internal Medicine

## 2019-09-03 DIAGNOSIS — Z1231 Encounter for screening mammogram for malignant neoplasm of breast: Secondary | ICD-10-CM | POA: Diagnosis not present

## 2019-09-11 DIAGNOSIS — I8311 Varicose veins of right lower extremity with inflammation: Secondary | ICD-10-CM | POA: Diagnosis not present

## 2019-09-11 DIAGNOSIS — L820 Inflamed seborrheic keratosis: Secondary | ICD-10-CM | POA: Diagnosis not present

## 2019-09-11 DIAGNOSIS — D225 Melanocytic nevi of trunk: Secondary | ICD-10-CM | POA: Diagnosis not present

## 2019-09-11 DIAGNOSIS — L304 Erythema intertrigo: Secondary | ICD-10-CM | POA: Diagnosis not present

## 2019-09-11 DIAGNOSIS — D1801 Hemangioma of skin and subcutaneous tissue: Secondary | ICD-10-CM | POA: Diagnosis not present

## 2019-09-11 DIAGNOSIS — L821 Other seborrheic keratosis: Secondary | ICD-10-CM | POA: Diagnosis not present

## 2019-09-11 DIAGNOSIS — L0100 Impetigo, unspecified: Secondary | ICD-10-CM | POA: Diagnosis not present

## 2019-09-11 DIAGNOSIS — I8312 Varicose veins of left lower extremity with inflammation: Secondary | ICD-10-CM | POA: Diagnosis not present

## 2019-09-11 DIAGNOSIS — L218 Other seborrheic dermatitis: Secondary | ICD-10-CM | POA: Diagnosis not present

## 2019-10-07 DIAGNOSIS — Z23 Encounter for immunization: Secondary | ICD-10-CM | POA: Diagnosis not present

## 2019-10-26 DIAGNOSIS — E7800 Pure hypercholesterolemia, unspecified: Secondary | ICD-10-CM | POA: Diagnosis not present

## 2019-10-31 DIAGNOSIS — E782 Mixed hyperlipidemia: Secondary | ICD-10-CM | POA: Diagnosis not present

## 2019-10-31 DIAGNOSIS — S6992XA Unspecified injury of left wrist, hand and finger(s), initial encounter: Secondary | ICD-10-CM | POA: Diagnosis not present

## 2019-10-31 DIAGNOSIS — I10 Essential (primary) hypertension: Secondary | ICD-10-CM | POA: Diagnosis not present

## 2019-10-31 DIAGNOSIS — E559 Vitamin D deficiency, unspecified: Secondary | ICD-10-CM | POA: Diagnosis not present

## 2019-10-31 DIAGNOSIS — M25532 Pain in left wrist: Secondary | ICD-10-CM | POA: Diagnosis not present

## 2019-10-31 DIAGNOSIS — E1069 Type 1 diabetes mellitus with other specified complication: Secondary | ICD-10-CM | POA: Diagnosis not present

## 2019-10-31 DIAGNOSIS — E039 Hypothyroidism, unspecified: Secondary | ICD-10-CM | POA: Diagnosis not present

## 2019-11-01 DIAGNOSIS — E039 Hypothyroidism, unspecified: Secondary | ICD-10-CM | POA: Diagnosis not present

## 2019-11-01 DIAGNOSIS — E109 Type 1 diabetes mellitus without complications: Secondary | ICD-10-CM | POA: Diagnosis not present

## 2019-11-01 DIAGNOSIS — E559 Vitamin D deficiency, unspecified: Secondary | ICD-10-CM | POA: Diagnosis not present

## 2019-11-01 DIAGNOSIS — Z9641 Presence of insulin pump (external) (internal): Secondary | ICD-10-CM | POA: Diagnosis not present

## 2019-11-01 DIAGNOSIS — E7800 Pure hypercholesterolemia, unspecified: Secondary | ICD-10-CM | POA: Diagnosis not present

## 2019-11-01 DIAGNOSIS — I10 Essential (primary) hypertension: Secondary | ICD-10-CM | POA: Diagnosis not present

## 2019-11-01 DIAGNOSIS — E1069 Type 1 diabetes mellitus with other specified complication: Secondary | ICD-10-CM | POA: Diagnosis not present

## 2019-11-01 DIAGNOSIS — M810 Age-related osteoporosis without current pathological fracture: Secondary | ICD-10-CM | POA: Diagnosis not present

## 2019-11-04 DIAGNOSIS — Z23 Encounter for immunization: Secondary | ICD-10-CM | POA: Diagnosis not present

## 2019-11-27 DIAGNOSIS — M654 Radial styloid tenosynovitis [de Quervain]: Secondary | ICD-10-CM | POA: Diagnosis not present

## 2019-11-27 DIAGNOSIS — M720 Palmar fascial fibromatosis [Dupuytren]: Secondary | ICD-10-CM | POA: Diagnosis not present

## 2020-01-25 DIAGNOSIS — E1069 Type 1 diabetes mellitus with other specified complication: Secondary | ICD-10-CM | POA: Diagnosis not present

## 2020-01-25 DIAGNOSIS — E559 Vitamin D deficiency, unspecified: Secondary | ICD-10-CM | POA: Diagnosis not present

## 2020-01-25 DIAGNOSIS — E039 Hypothyroidism, unspecified: Secondary | ICD-10-CM | POA: Diagnosis not present

## 2020-01-25 DIAGNOSIS — I10 Essential (primary) hypertension: Secondary | ICD-10-CM | POA: Diagnosis not present

## 2020-01-31 DIAGNOSIS — Z794 Long term (current) use of insulin: Secondary | ICD-10-CM | POA: Diagnosis not present

## 2020-01-31 DIAGNOSIS — M810 Age-related osteoporosis without current pathological fracture: Secondary | ICD-10-CM | POA: Diagnosis not present

## 2020-01-31 DIAGNOSIS — I10 Essential (primary) hypertension: Secondary | ICD-10-CM | POA: Diagnosis not present

## 2020-01-31 DIAGNOSIS — E559 Vitamin D deficiency, unspecified: Secondary | ICD-10-CM | POA: Diagnosis not present

## 2020-01-31 DIAGNOSIS — K3184 Gastroparesis: Secondary | ICD-10-CM | POA: Diagnosis not present

## 2020-01-31 DIAGNOSIS — E1069 Type 1 diabetes mellitus with other specified complication: Secondary | ICD-10-CM | POA: Diagnosis not present

## 2020-01-31 DIAGNOSIS — E039 Hypothyroidism, unspecified: Secondary | ICD-10-CM | POA: Diagnosis not present

## 2020-01-31 DIAGNOSIS — K219 Gastro-esophageal reflux disease without esophagitis: Secondary | ICD-10-CM | POA: Diagnosis not present

## 2020-01-31 DIAGNOSIS — Z9641 Presence of insulin pump (external) (internal): Secondary | ICD-10-CM | POA: Diagnosis not present

## 2020-01-31 DIAGNOSIS — E1140 Type 2 diabetes mellitus with diabetic neuropathy, unspecified: Secondary | ICD-10-CM | POA: Diagnosis not present

## 2020-01-31 DIAGNOSIS — L932 Other local lupus erythematosus: Secondary | ICD-10-CM | POA: Diagnosis not present

## 2020-01-31 DIAGNOSIS — E7800 Pure hypercholesterolemia, unspecified: Secondary | ICD-10-CM | POA: Diagnosis not present

## 2020-02-06 DIAGNOSIS — I10 Essential (primary) hypertension: Secondary | ICD-10-CM | POA: Diagnosis not present

## 2020-02-06 DIAGNOSIS — E7800 Pure hypercholesterolemia, unspecified: Secondary | ICD-10-CM | POA: Diagnosis not present

## 2020-02-06 DIAGNOSIS — F419 Anxiety disorder, unspecified: Secondary | ICD-10-CM | POA: Diagnosis not present

## 2020-02-06 DIAGNOSIS — F319 Bipolar disorder, unspecified: Secondary | ICD-10-CM | POA: Diagnosis not present

## 2020-04-08 DIAGNOSIS — Z23 Encounter for immunization: Secondary | ICD-10-CM | POA: Diagnosis not present

## 2020-05-05 DIAGNOSIS — E559 Vitamin D deficiency, unspecified: Secondary | ICD-10-CM | POA: Diagnosis not present

## 2020-05-05 DIAGNOSIS — E039 Hypothyroidism, unspecified: Secondary | ICD-10-CM | POA: Diagnosis not present

## 2020-05-05 DIAGNOSIS — E109 Type 1 diabetes mellitus without complications: Secondary | ICD-10-CM | POA: Diagnosis not present

## 2020-05-05 DIAGNOSIS — Z9641 Presence of insulin pump (external) (internal): Secondary | ICD-10-CM | POA: Diagnosis not present

## 2020-05-05 DIAGNOSIS — I10 Essential (primary) hypertension: Secondary | ICD-10-CM | POA: Diagnosis not present

## 2020-05-05 DIAGNOSIS — E7800 Pure hypercholesterolemia, unspecified: Secondary | ICD-10-CM | POA: Diagnosis not present

## 2020-05-05 DIAGNOSIS — M810 Age-related osteoporosis without current pathological fracture: Secondary | ICD-10-CM | POA: Diagnosis not present

## 2020-05-05 DIAGNOSIS — E1069 Type 1 diabetes mellitus with other specified complication: Secondary | ICD-10-CM | POA: Diagnosis not present

## 2020-05-14 DIAGNOSIS — E109 Type 1 diabetes mellitus without complications: Secondary | ICD-10-CM | POA: Diagnosis not present

## 2020-05-14 DIAGNOSIS — E039 Hypothyroidism, unspecified: Secondary | ICD-10-CM | POA: Diagnosis not present

## 2020-05-14 DIAGNOSIS — E559 Vitamin D deficiency, unspecified: Secondary | ICD-10-CM | POA: Diagnosis not present

## 2020-05-14 DIAGNOSIS — M810 Age-related osteoporosis without current pathological fracture: Secondary | ICD-10-CM | POA: Diagnosis not present

## 2020-05-14 DIAGNOSIS — E7800 Pure hypercholesterolemia, unspecified: Secondary | ICD-10-CM | POA: Diagnosis not present

## 2020-05-14 DIAGNOSIS — I10 Essential (primary) hypertension: Secondary | ICD-10-CM | POA: Diagnosis not present

## 2020-05-21 ENCOUNTER — Other Ambulatory Visit: Payer: Self-pay | Admitting: Internal Medicine

## 2020-05-21 DIAGNOSIS — K219 Gastro-esophageal reflux disease without esophagitis: Secondary | ICD-10-CM | POA: Diagnosis not present

## 2020-05-21 DIAGNOSIS — E039 Hypothyroidism, unspecified: Secondary | ICD-10-CM | POA: Diagnosis not present

## 2020-05-21 DIAGNOSIS — Z0000 Encounter for general adult medical examination without abnormal findings: Secondary | ICD-10-CM | POA: Diagnosis not present

## 2020-05-21 DIAGNOSIS — D72829 Elevated white blood cell count, unspecified: Secondary | ICD-10-CM | POA: Diagnosis not present

## 2020-05-21 DIAGNOSIS — E109 Type 1 diabetes mellitus without complications: Secondary | ICD-10-CM | POA: Diagnosis not present

## 2020-05-21 DIAGNOSIS — E7800 Pure hypercholesterolemia, unspecified: Secondary | ICD-10-CM | POA: Diagnosis not present

## 2020-05-21 DIAGNOSIS — F17200 Nicotine dependence, unspecified, uncomplicated: Secondary | ICD-10-CM

## 2020-05-21 DIAGNOSIS — Z23 Encounter for immunization: Secondary | ICD-10-CM | POA: Diagnosis not present

## 2020-06-02 DIAGNOSIS — M810 Age-related osteoporosis without current pathological fracture: Secondary | ICD-10-CM | POA: Diagnosis not present

## 2020-06-06 ENCOUNTER — Other Ambulatory Visit: Payer: Self-pay

## 2020-06-06 ENCOUNTER — Ambulatory Visit
Admission: RE | Admit: 2020-06-06 | Discharge: 2020-06-06 | Disposition: A | Discharge status: Home / Self Care | Payer: Medicare Other | Source: Ambulatory Visit | Attending: Internal Medicine | Admitting: Internal Medicine

## 2020-06-06 DIAGNOSIS — I2510 Atherosclerotic heart disease of native coronary artery without angina pectoris: Secondary | ICD-10-CM | POA: Diagnosis not present

## 2020-06-06 DIAGNOSIS — F17200 Nicotine dependence, unspecified, uncomplicated: Secondary | ICD-10-CM

## 2020-06-06 DIAGNOSIS — K449 Diaphragmatic hernia without obstruction or gangrene: Secondary | ICD-10-CM | POA: Diagnosis not present

## 2020-06-06 DIAGNOSIS — S2241XA Multiple fractures of ribs, right side, initial encounter for closed fracture: Secondary | ICD-10-CM | POA: Diagnosis not present

## 2020-06-06 DIAGNOSIS — F17210 Nicotine dependence, cigarettes, uncomplicated: Secondary | ICD-10-CM | POA: Diagnosis not present

## 2020-06-28 ENCOUNTER — Other Ambulatory Visit: Payer: Self-pay

## 2020-06-28 ENCOUNTER — Emergency Department (HOSPITAL_BASED_OUTPATIENT_CLINIC_OR_DEPARTMENT_OTHER): Payer: Medicare Other

## 2020-06-28 ENCOUNTER — Emergency Department (HOSPITAL_BASED_OUTPATIENT_CLINIC_OR_DEPARTMENT_OTHER)
Admission: EM | Admit: 2020-06-28 | Discharge: 2020-06-28 | Disposition: A | Discharge status: Home / Self Care | Payer: Medicare Other | Attending: Emergency Medicine | Admitting: Emergency Medicine

## 2020-06-28 ENCOUNTER — Encounter (HOSPITAL_BASED_OUTPATIENT_CLINIC_OR_DEPARTMENT_OTHER): Payer: Self-pay | Admitting: Emergency Medicine

## 2020-06-28 DIAGNOSIS — Z85820 Personal history of malignant melanoma of skin: Secondary | ICD-10-CM | POA: Insufficient documentation

## 2020-06-28 DIAGNOSIS — Z7901 Long term (current) use of anticoagulants: Secondary | ICD-10-CM | POA: Insufficient documentation

## 2020-06-28 DIAGNOSIS — I82401 Acute embolism and thrombosis of unspecified deep veins of right lower extremity: Secondary | ICD-10-CM | POA: Diagnosis not present

## 2020-06-28 DIAGNOSIS — I82411 Acute embolism and thrombosis of right femoral vein: Secondary | ICD-10-CM | POA: Diagnosis not present

## 2020-06-28 DIAGNOSIS — Z794 Long term (current) use of insulin: Secondary | ICD-10-CM | POA: Insufficient documentation

## 2020-06-28 DIAGNOSIS — Z79899 Other long term (current) drug therapy: Secondary | ICD-10-CM | POA: Diagnosis not present

## 2020-06-28 DIAGNOSIS — I824Y1 Acute embolism and thrombosis of unspecified deep veins of right proximal lower extremity: Secondary | ICD-10-CM | POA: Diagnosis not present

## 2020-06-28 DIAGNOSIS — M25561 Pain in right knee: Secondary | ICD-10-CM | POA: Diagnosis present

## 2020-06-28 DIAGNOSIS — M79651 Pain in right thigh: Secondary | ICD-10-CM | POA: Diagnosis not present

## 2020-06-28 DIAGNOSIS — E039 Hypothyroidism, unspecified: Secondary | ICD-10-CM | POA: Insufficient documentation

## 2020-06-28 DIAGNOSIS — E1040 Type 1 diabetes mellitus with diabetic neuropathy, unspecified: Secondary | ICD-10-CM | POA: Diagnosis not present

## 2020-06-28 DIAGNOSIS — I10 Essential (primary) hypertension: Secondary | ICD-10-CM | POA: Diagnosis not present

## 2020-06-28 DIAGNOSIS — F17210 Nicotine dependence, cigarettes, uncomplicated: Secondary | ICD-10-CM | POA: Diagnosis not present

## 2020-06-28 DIAGNOSIS — I82431 Acute embolism and thrombosis of right popliteal vein: Secondary | ICD-10-CM | POA: Diagnosis not present

## 2020-06-28 LAB — BASIC METABOLIC PANEL
Anion gap: 8 (ref 5–15)
BUN: 15 mg/dL (ref 8–23)
CO2: 22 mmol/L (ref 22–32)
Calcium: 8.7 mg/dL — ABNORMAL LOW (ref 8.9–10.3)
Chloride: 101 mmol/L (ref 98–111)
Creatinine, Ser: 0.89 mg/dL (ref 0.44–1.00)
GFR, Estimated: >60 mL/min (ref >60)
Glucose, Bld: 187 mg/dL — ABNORMAL HIGH (ref 70–99)
Potassium: 4.1 mmol/L (ref 3.5–5.1)
Sodium: 131 mmol/L — ABNORMAL LOW (ref 135–145)

## 2020-06-28 LAB — CBC
HCT: 45.7 % (ref 36.0–46.0)
Hemoglobin: 15.3 g/dL — ABNORMAL HIGH (ref 12.0–15.0)
MCH: 30.1 pg (ref 26.0–34.0)
MCHC: 33.5 g/dL (ref 30.0–36.0)
MCV: 89.8 fL (ref 80.0–100.0)
Platelets: 284 10*3/uL (ref 150–400)
RBC: 5.09 MIL/uL (ref 3.87–5.11)
RDW: 13.1 % (ref 11.5–15.5)
WBC: 13 10*3/uL — ABNORMAL HIGH (ref 4.0–10.5)
nRBC: 0 % (ref 0.0–0.2)

## 2020-06-28 LAB — PROTIME-INR
INR: 1 (ref 0.8–1.2)
Prothrombin Time: 12.6 seconds (ref 11.4–15.2)

## 2020-06-28 MED ORDER — APIXABAN 2.5 MG PO TABS: 5.0000 mg | ORAL_TABLET | Freq: Two times a day (BID) | ORAL | Status: DC

## 2020-06-28 MED ORDER — APIXABAN 5 MG PO TABS: ORAL_TABLET | ORAL | 0 refills | Status: AC

## 2020-06-28 MED ORDER — APIXABAN 2.5 MG PO TABS
10.0000 mg | ORAL_TABLET | Freq: Two times a day (BID) | ORAL | Status: DC
  Administered 2020-06-28: 10 mg via ORAL
  Filled 2020-06-28: qty 4

## 2020-06-28 MED ORDER — ACETAMINOPHEN 325 MG PO TABS
650.0000 mg | ORAL_TABLET | Freq: Once | ORAL | Status: AC
  Administered 2020-06-28: 650 mg via ORAL
  Filled 2020-06-28: qty 2

## 2020-06-28 MED ORDER — APIXABAN (ELIQUIS) VTE STARTER PACK (10MG AND 5MG): ORAL_TABLET | ORAL | 0 refills | Status: DC

## 2020-06-28 NOTE — ED Notes (Signed)
Pt aware [place] does not arrive until 9:00.
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
## 2020-06-28 NOTE — ED Triage Notes (Signed)
Pain behind R knee x 2 days. Denies injury.

## 2020-06-28 NOTE — ED Notes (Signed)
Patient transported to X-ray 

## 2020-06-28 NOTE — ED Notes (Signed)
Called and spoke with Brooke Avery who states that no pharmacies carry the apixaban starter packs and for provider to write prescription for 5mg  eliquis and to write the prescription for the individual eliquis with doses and instructions. Will inform provider

## 2020-06-28 NOTE — ED Notes (Signed)
[facility] on [address] to confirm they have apixaban starter pack, pharmacist states they do not have that medication. Called [facility] and spoke with pharmacy tech there who states she is not sure if they carry that pack and the pharmacist is tied up. Will be calling back this RN when available. Informed patient that medication is not available at her pharmacy and that we may need to send medication to [facility] in [place] to pick it up, she states she cannot drive out there to get medication and requests to send it somewhere here in [facility]. This RN Printmaker, pharmacist, to provide guidance on who supplies prescription.

## 2020-06-28 NOTE — Discharge Instructions (Addendum)
Start taking the medication as prescribed.  Your dose will change as directed on the prescription.  Return to the ED for issues with bleeding, chest pain or shortness of breath.  You will need to continue on this medication and will need a refill from your doctor in the next couple of weeks.

## 2020-06-28 NOTE — ED Notes (Signed)
ED Provider at bedside. 

## 2020-07-21 DIAGNOSIS — I82401 Acute embolism and thrombosis of unspecified deep veins of right lower extremity: Secondary | ICD-10-CM | POA: Diagnosis not present

## 2020-07-21 DIAGNOSIS — S61211A Laceration without foreign body of left index finger without damage to nail, initial encounter: Secondary | ICD-10-CM | POA: Diagnosis not present

## 2020-07-31 DIAGNOSIS — E109 Type 1 diabetes mellitus without complications: Secondary | ICD-10-CM | POA: Diagnosis not present

## 2020-07-31 DIAGNOSIS — E7800 Pure hypercholesterolemia, unspecified: Secondary | ICD-10-CM | POA: Diagnosis not present

## 2020-07-31 DIAGNOSIS — E559 Vitamin D deficiency, unspecified: Secondary | ICD-10-CM | POA: Diagnosis not present

## 2020-08-07 ENCOUNTER — Other Ambulatory Visit: Payer: Self-pay | Admitting: Internal Medicine

## 2020-08-07 DIAGNOSIS — Z9641 Presence of insulin pump (external) (internal): Secondary | ICD-10-CM | POA: Diagnosis not present

## 2020-08-07 DIAGNOSIS — Z1231 Encounter for screening mammogram for malignant neoplasm of breast: Secondary | ICD-10-CM

## 2020-08-07 DIAGNOSIS — E559 Vitamin D deficiency, unspecified: Secondary | ICD-10-CM | POA: Diagnosis not present

## 2020-08-07 DIAGNOSIS — D751 Secondary polycythemia: Secondary | ICD-10-CM | POA: Diagnosis not present

## 2020-08-07 DIAGNOSIS — I10 Essential (primary) hypertension: Secondary | ICD-10-CM | POA: Diagnosis not present

## 2020-08-07 DIAGNOSIS — M810 Age-related osteoporosis without current pathological fracture: Secondary | ICD-10-CM | POA: Diagnosis not present

## 2020-08-07 DIAGNOSIS — E7800 Pure hypercholesterolemia, unspecified: Secondary | ICD-10-CM | POA: Diagnosis not present

## 2020-08-07 DIAGNOSIS — E1069 Type 1 diabetes mellitus with other specified complication: Secondary | ICD-10-CM | POA: Diagnosis not present

## 2020-08-07 DIAGNOSIS — E039 Hypothyroidism, unspecified: Secondary | ICD-10-CM | POA: Diagnosis not present

## 2020-08-11 ENCOUNTER — Telehealth: Payer: Self-pay | Admitting: Family

## 2020-08-11 NOTE — Telephone Encounter (Signed)
Patient called and LMVM  requesting to cancel her New Patient appointments.  I cancelled appointments and send in basket message to referring provider Dr Deland Pretty regarding this as well.  Referral has been closed at this time

## 2020-08-25 ENCOUNTER — Other Ambulatory Visit: Payer: Self-pay | Admitting: Internal Medicine

## 2020-08-25 DIAGNOSIS — Z1231 Encounter for screening mammogram for malignant neoplasm of breast: Secondary | ICD-10-CM

## 2020-08-26 ENCOUNTER — Other Ambulatory Visit: Payer: Medicare Other

## 2020-08-26 ENCOUNTER — Ambulatory Visit: Payer: Medicare Other | Admitting: Family

## 2020-08-27 DIAGNOSIS — Z1231 Encounter for screening mammogram for malignant neoplasm of breast: Secondary | ICD-10-CM

## 2020-09-16 DIAGNOSIS — Z1231 Encounter for screening mammogram for malignant neoplasm of breast: Secondary | ICD-10-CM

## 2020-09-23 DIAGNOSIS — Z1231 Encounter for screening mammogram for malignant neoplasm of breast: Secondary | ICD-10-CM

## 2020-10-21 DIAGNOSIS — I82501 Chronic embolism and thrombosis of unspecified deep veins of right lower extremity: Secondary | ICD-10-CM | POA: Diagnosis not present

## 2020-10-21 DIAGNOSIS — E1069 Type 1 diabetes mellitus with other specified complication: Secondary | ICD-10-CM | POA: Diagnosis not present

## 2020-10-21 DIAGNOSIS — I10 Essential (primary) hypertension: Secondary | ICD-10-CM | POA: Diagnosis not present

## 2020-10-22 ENCOUNTER — Telehealth: Payer: Self-pay | Admitting: *Deleted

## 2020-10-22 NOTE — Telephone Encounter (Signed)
Per referral 10/21/20 Dr. Lauralyn Primes called and gave patient upcoming appointments - mailed calendar with welcome packets

## 2020-10-30 DIAGNOSIS — E109 Type 1 diabetes mellitus without complications: Secondary | ICD-10-CM | POA: Diagnosis not present

## 2020-10-30 DIAGNOSIS — E7800 Pure hypercholesterolemia, unspecified: Secondary | ICD-10-CM | POA: Diagnosis not present

## 2020-11-04 DIAGNOSIS — K3184 Gastroparesis: Secondary | ICD-10-CM | POA: Diagnosis not present

## 2020-11-04 DIAGNOSIS — Z9641 Presence of insulin pump (external) (internal): Secondary | ICD-10-CM | POA: Diagnosis not present

## 2020-11-04 DIAGNOSIS — E559 Vitamin D deficiency, unspecified: Secondary | ICD-10-CM | POA: Diagnosis not present

## 2020-11-04 DIAGNOSIS — E1140 Type 2 diabetes mellitus with diabetic neuropathy, unspecified: Secondary | ICD-10-CM | POA: Diagnosis not present

## 2020-11-04 DIAGNOSIS — M810 Age-related osteoporosis without current pathological fracture: Secondary | ICD-10-CM | POA: Diagnosis not present

## 2020-11-04 DIAGNOSIS — E7800 Pure hypercholesterolemia, unspecified: Secondary | ICD-10-CM | POA: Diagnosis not present

## 2020-11-04 DIAGNOSIS — E109 Type 1 diabetes mellitus without complications: Secondary | ICD-10-CM | POA: Diagnosis not present

## 2020-11-04 DIAGNOSIS — I10 Essential (primary) hypertension: Secondary | ICD-10-CM | POA: Diagnosis not present

## 2020-11-04 DIAGNOSIS — E039 Hypothyroidism, unspecified: Secondary | ICD-10-CM | POA: Diagnosis not present

## 2020-11-18 DIAGNOSIS — M810 Age-related osteoporosis without current pathological fracture: Secondary | ICD-10-CM | POA: Diagnosis not present

## 2020-11-18 DIAGNOSIS — E109 Type 1 diabetes mellitus without complications: Secondary | ICD-10-CM | POA: Diagnosis not present

## 2020-11-18 DIAGNOSIS — E559 Vitamin D deficiency, unspecified: Secondary | ICD-10-CM | POA: Diagnosis not present

## 2020-11-18 DIAGNOSIS — K219 Gastro-esophageal reflux disease without esophagitis: Secondary | ICD-10-CM | POA: Diagnosis not present

## 2020-11-18 DIAGNOSIS — I82501 Chronic embolism and thrombosis of unspecified deep veins of right lower extremity: Secondary | ICD-10-CM | POA: Diagnosis not present

## 2020-11-18 DIAGNOSIS — E7800 Pure hypercholesterolemia, unspecified: Secondary | ICD-10-CM | POA: Diagnosis not present

## 2020-11-25 ENCOUNTER — Inpatient Hospital Stay: Payer: Medicare Other | Attending: Hematology & Oncology | Admitting: Hematology & Oncology

## 2020-11-25 ENCOUNTER — Inpatient Hospital Stay: Payer: Medicare Other

## 2020-11-25 ENCOUNTER — Other Ambulatory Visit: Payer: Self-pay

## 2020-11-25 VITALS — BP 144/72 | HR 97 | Temp 98.4°F | Resp 19 | Ht 66.0 in | Wt 180.0 lb

## 2020-11-25 DIAGNOSIS — Z7901 Long term (current) use of anticoagulants: Secondary | ICD-10-CM

## 2020-11-25 DIAGNOSIS — I82401 Acute embolism and thrombosis of unspecified deep veins of right lower extremity: Secondary | ICD-10-CM

## 2020-11-25 DIAGNOSIS — I82411 Acute embolism and thrombosis of right femoral vein: Secondary | ICD-10-CM | POA: Diagnosis not present

## 2020-11-25 DIAGNOSIS — F17210 Nicotine dependence, cigarettes, uncomplicated: Secondary | ICD-10-CM

## 2020-11-25 DIAGNOSIS — E119 Type 2 diabetes mellitus without complications: Secondary | ICD-10-CM | POA: Diagnosis not present

## 2020-11-25 LAB — CBC WITH DIFFERENTIAL (CANCER CENTER ONLY)
Abs Immature Granulocytes: 0.05 10*3/uL (ref 0.00–0.07)
Basophils Absolute: 0.1 10*3/uL (ref 0.0–0.1)
Basophils Relative: 0 %
Eosinophils Absolute: 0.2 10*3/uL (ref 0.0–0.5)
Eosinophils Relative: 2 %
HCT: 48.9 % — ABNORMAL HIGH (ref 36.0–46.0)
Hemoglobin: 16.1 g/dL — ABNORMAL HIGH (ref 12.0–15.0)
Immature Granulocytes: 0 %
Lymphocytes Relative: 16 %
Lymphs Abs: 2.1 10*3/uL (ref 0.7–4.0)
MCH: 29.1 pg (ref 26.0–34.0)
MCHC: 32.9 g/dL (ref 30.0–36.0)
MCV: 88.3 fL (ref 80.0–100.0)
Monocytes Absolute: 0.9 10*3/uL (ref 0.1–1.0)
Monocytes Relative: 7 %
Neutro Abs: 9.6 10*3/uL — ABNORMAL HIGH (ref 1.7–7.7)
Neutrophils Relative %: 75 %
Platelet Count: 306 10*3/uL (ref 150–400)
RBC: 5.54 MIL/uL — ABNORMAL HIGH (ref 3.87–5.11)
RDW: 13.3 % (ref 11.5–15.5)
WBC Count: 12.9 10*3/uL — ABNORMAL HIGH (ref 4.0–10.5)
nRBC: 0 % (ref 0.0–0.2)

## 2020-11-25 LAB — CMP (CANCER CENTER ONLY)
ALT: 7 U/L (ref 0–44)
AST: 18 U/L (ref 15–41)
Albumin: 4.3 g/dL (ref 3.5–5.0)
Alkaline Phosphatase: 82 U/L (ref 38–126)
Anion gap: 9 (ref 5–15)
BUN: 13 mg/dL (ref 8–23)
CO2: 25 mmol/L (ref 22–32)
Calcium: 10.1 mg/dL (ref 8.9–10.3)
Chloride: 99 mmol/L (ref 98–111)
Creatinine: 0.99 mg/dL (ref 0.44–1.00)
GFR, Estimated: >60 mL/min (ref >60)
Glucose, Bld: 258 mg/dL — ABNORMAL HIGH (ref 70–99)
Potassium: 4.2 mmol/L (ref 3.5–5.1)
Sodium: 133 mmol/L — ABNORMAL LOW (ref 135–145)
Total Bilirubin: 0.5 mg/dL (ref 0.3–1.2)
Total Protein: 7.1 g/dL (ref 6.5–8.1)

## 2020-11-25 LAB — D-DIMER, QUANTITATIVE: D-Dimer, Quant: 0.41 ug/mL-FEU (ref 0.00–0.50)

## 2020-11-25 LAB — ANTITHROMBIN III: AntiThromb III Func: 110 % (ref 75–120)

## 2020-11-26 ENCOUNTER — Telehealth: Payer: Self-pay

## 2020-11-26 LAB — CARDIOLIPIN ANTIBODIES, IGG, IGM, IGA
Anticardiolipin IgA: <9 APL U/mL (ref 0–11)
Anticardiolipin IgG: <9 GPL U/mL (ref 0–14)
Anticardiolipin IgM: <9 MPL U/mL (ref 0–12)

## 2020-11-26 LAB — DRVVT MIX: dRVVT Mix: 50.5 s — ABNORMAL HIGH (ref 0.0–40.4)

## 2020-11-26 LAB — HOMOCYSTEINE: Homocysteine: 7.9 umol/L (ref 0.0–17.2)

## 2020-11-26 LAB — BETA-2-GLYCOPROTEIN I ABS, IGG/M/A
Beta-2 Glyco I IgG: <9 GPI IgG units (ref 0–20)
Beta-2-Glycoprotein I IgA: <9 GPI IgA units (ref 0–25)
Beta-2-Glycoprotein I IgM: <9 GPI IgM units (ref 0–32)

## 2020-11-26 LAB — PROTEIN S ACTIVITY: Protein S Activity: 97 % (ref 63–140)

## 2020-11-26 LAB — PROTEIN C, TOTAL: Protein C, Total: 132 % (ref 60–150)

## 2020-11-26 LAB — PROTEIN C ACTIVITY: Protein C Activity: 156 % (ref 73–180)

## 2020-11-26 LAB — DRVVT CONFIRM: dRVVT Confirm: 1 ratio (ref 0.8–1.2)

## 2020-11-26 LAB — PROTEIN S, TOTAL: Protein S Ag, Total: 119 % (ref 60–150)

## 2020-11-26 LAB — LUPUS ANTICOAGULANT PANEL
DRVVT: 71.5 s — ABNORMAL HIGH (ref 0.0–47.0)
PTT Lupus Anticoagulant: 34 s (ref 0.0–51.9)
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
## 2020-11-26 NOTE — Telephone Encounter (Signed)
No 11/25/20 LOS   Brooke Avery

## 2020-12-01 LAB — FACTOR 5 LEIDEN

## 2020-12-02 LAB — PROTHROMBIN GENE MUTATION

## 2020-12-25 ENCOUNTER — Ambulatory Visit (HOSPITAL_BASED_OUTPATIENT_CLINIC_OR_DEPARTMENT_OTHER)
Admission: RE | Admit: 2020-12-25 | Discharge: 2020-12-25 | Disposition: A | Discharge status: Home / Self Care | Payer: Medicare Other | Source: Ambulatory Visit | Attending: Hematology & Oncology | Admitting: Hematology & Oncology

## 2020-12-25 ENCOUNTER — Other Ambulatory Visit: Payer: Self-pay

## 2020-12-25 ENCOUNTER — Encounter: Payer: Self-pay | Admitting: *Deleted

## 2020-12-25 DIAGNOSIS — I82401 Acute embolism and thrombosis of unspecified deep veins of right lower extremity: Secondary | ICD-10-CM | POA: Diagnosis not present

## 2020-12-25 DIAGNOSIS — I82431 Acute embolism and thrombosis of right popliteal vein: Secondary | ICD-10-CM | POA: Diagnosis not present

## 2020-12-25 DIAGNOSIS — I824Z1 Acute embolism and thrombosis of unspecified deep veins of right distal lower extremity: Secondary | ICD-10-CM | POA: Diagnosis not present

## 2020-12-25 DIAGNOSIS — I82411 Acute embolism and thrombosis of right femoral vein: Secondary | ICD-10-CM | POA: Diagnosis not present

## 2020-12-26 ENCOUNTER — Other Ambulatory Visit: Payer: Self-pay | Admitting: *Deleted

## 2020-12-26 MED ORDER — APIXABAN 2.5 MG PO TABS: 2.5000 mg | ORAL_TABLET | Freq: Two times a day (BID) | ORAL | 3 refills | Status: AC

## 2021-01-08 ENCOUNTER — Ambulatory Visit: Payer: Medicare Other | Admitting: Hematology & Oncology

## 2021-01-08 ENCOUNTER — Other Ambulatory Visit: Payer: Medicare Other

## 2021-01-08 ENCOUNTER — Ambulatory Visit: Payer: Medicare Other

## 2021-01-09 ENCOUNTER — Encounter: Payer: Self-pay | Admitting: Hematology & Oncology

## 2021-01-27 DIAGNOSIS — Z23 Encounter for immunization: Secondary | ICD-10-CM | POA: Diagnosis not present

## 2021-01-28 DIAGNOSIS — E1069 Type 1 diabetes mellitus with other specified complication: Secondary | ICD-10-CM | POA: Diagnosis not present

## 2021-01-28 DIAGNOSIS — E7800 Pure hypercholesterolemia, unspecified: Secondary | ICD-10-CM | POA: Diagnosis not present

## 2021-02-03 DIAGNOSIS — I10 Essential (primary) hypertension: Secondary | ICD-10-CM | POA: Diagnosis not present

## 2021-02-03 DIAGNOSIS — Z9641 Presence of insulin pump (external) (internal): Secondary | ICD-10-CM | POA: Diagnosis not present

## 2021-02-03 DIAGNOSIS — E7800 Pure hypercholesterolemia, unspecified: Secondary | ICD-10-CM | POA: Diagnosis not present

## 2021-02-03 DIAGNOSIS — E039 Hypothyroidism, unspecified: Secondary | ICD-10-CM | POA: Diagnosis not present

## 2021-02-03 DIAGNOSIS — E1069 Type 1 diabetes mellitus with other specified complication: Secondary | ICD-10-CM | POA: Diagnosis not present

## 2021-03-05 DIAGNOSIS — E039 Hypothyroidism, unspecified: Secondary | ICD-10-CM | POA: Diagnosis not present

## 2021-03-05 DIAGNOSIS — E1069 Type 1 diabetes mellitus with other specified complication: Secondary | ICD-10-CM | POA: Diagnosis not present

## 2021-03-05 DIAGNOSIS — E7800 Pure hypercholesterolemia, unspecified: Secondary | ICD-10-CM | POA: Diagnosis not present

## 2021-03-05 DIAGNOSIS — Z9641 Presence of insulin pump (external) (internal): Secondary | ICD-10-CM | POA: Diagnosis not present

## 2021-03-05 DIAGNOSIS — I10 Essential (primary) hypertension: Secondary | ICD-10-CM | POA: Diagnosis not present

## 2021-03-31 DIAGNOSIS — M720 Palmar fascial fibromatosis [Dupuytren]: Secondary | ICD-10-CM | POA: Diagnosis not present

## 2021-03-31 DIAGNOSIS — M654 Radial styloid tenosynovitis [de Quervain]: Secondary | ICD-10-CM | POA: Diagnosis not present

## 2021-04-03 ENCOUNTER — Telehealth: Payer: Self-pay

## 2021-04-03 NOTE — Telephone Encounter (Signed)
   [facility] Pre-operative Risk Assessment    Request for surgical clearance:  What type of surgery is being performed? Right Ring Dupuytrens Fasciectomy    When is this surgery scheduled? 06/15/2021   What type of clearance is required (medical clearance vs. Pharmacy clearance to hold med vs. Both)? Both  Are there any medications that need to be held prior to surgery?  Eliquis , holding length not specified   Practice name and name of physician performing surgery? Dr. Iran Planas at [facility]   What is your office phone number:  [phone]   4.   What is your office fax number: [phone] att: Adline Potter  8.   Anesthesia type (None, local, MAC, general) ? Not specified    Brooke Avery 04/03/2021, 5:26 PM  _________________________________________________________________   (provider comments below) --

## 2021-04-06 NOTE — Telephone Encounter (Signed)
Notes have been faxed to surgeon's office.

## 2021-04-06 NOTE — Telephone Encounter (Signed)
Patient is not a heart care patient.  She has never been seen by her cardiologist.  Please contact requesting office.  Recommendations will need to come from prescribing provider.  Thank you.  Jossie Ng. Kalese Ensz NP-C    04/06/2021, 2:58 PM [address] Office [phone] Fax [phone]

## 2021-04-06 NOTE — Telephone Encounter (Signed)
Pt has never been seen by [facility] provider before. Clearance should come from managing provider.

## 2021-05-01 DIAGNOSIS — E1069 Type 1 diabetes mellitus with other specified complication: Secondary | ICD-10-CM | POA: Diagnosis not present

## 2021-05-01 DIAGNOSIS — E7800 Pure hypercholesterolemia, unspecified: Secondary | ICD-10-CM | POA: Diagnosis not present

## 2021-05-01 DIAGNOSIS — E039 Hypothyroidism, unspecified: Secondary | ICD-10-CM | POA: Diagnosis not present

## 2021-05-04 DIAGNOSIS — E1069 Type 1 diabetes mellitus with other specified complication: Secondary | ICD-10-CM | POA: Diagnosis not present

## 2021-05-04 DIAGNOSIS — M810 Age-related osteoporosis without current pathological fracture: Secondary | ICD-10-CM | POA: Diagnosis not present

## 2021-05-04 DIAGNOSIS — Z9641 Presence of insulin pump (external) (internal): Secondary | ICD-10-CM | POA: Diagnosis not present

## 2021-05-04 DIAGNOSIS — E1140 Type 2 diabetes mellitus with diabetic neuropathy, unspecified: Secondary | ICD-10-CM | POA: Diagnosis not present

## 2021-05-04 DIAGNOSIS — E7800 Pure hypercholesterolemia, unspecified: Secondary | ICD-10-CM | POA: Diagnosis not present

## 2021-05-04 DIAGNOSIS — E039 Hypothyroidism, unspecified: Secondary | ICD-10-CM | POA: Diagnosis not present

## 2021-05-14 DIAGNOSIS — E7801 Familial hypercholesterolemia: Secondary | ICD-10-CM | POA: Diagnosis not present

## 2021-05-14 DIAGNOSIS — E039 Hypothyroidism, unspecified: Secondary | ICD-10-CM | POA: Diagnosis not present

## 2021-05-14 DIAGNOSIS — E559 Vitamin D deficiency, unspecified: Secondary | ICD-10-CM | POA: Diagnosis not present

## 2021-05-14 DIAGNOSIS — Z0000 Encounter for general adult medical examination without abnormal findings: Secondary | ICD-10-CM | POA: Diagnosis not present

## 2021-05-14 DIAGNOSIS — E109 Type 1 diabetes mellitus without complications: Secondary | ICD-10-CM | POA: Diagnosis not present

## 2021-05-26 DIAGNOSIS — I82501 Chronic embolism and thrombosis of unspecified deep veins of right lower extremity: Secondary | ICD-10-CM | POA: Diagnosis not present

## 2021-05-26 DIAGNOSIS — E7800 Pure hypercholesterolemia, unspecified: Secondary | ICD-10-CM | POA: Diagnosis not present

## 2021-05-26 DIAGNOSIS — I10 Essential (primary) hypertension: Secondary | ICD-10-CM | POA: Diagnosis not present

## 2021-05-26 DIAGNOSIS — E559 Vitamin D deficiency, unspecified: Secondary | ICD-10-CM | POA: Diagnosis not present

## 2021-05-26 DIAGNOSIS — Z7901 Long term (current) use of anticoagulants: Secondary | ICD-10-CM | POA: Diagnosis not present

## 2021-05-26 DIAGNOSIS — E039 Hypothyroidism, unspecified: Secondary | ICD-10-CM | POA: Diagnosis not present

## 2021-05-26 DIAGNOSIS — M810 Age-related osteoporosis without current pathological fracture: Secondary | ICD-10-CM | POA: Diagnosis not present

## 2021-05-26 DIAGNOSIS — F17200 Nicotine dependence, unspecified, uncomplicated: Secondary | ICD-10-CM | POA: Diagnosis not present

## 2021-05-26 DIAGNOSIS — E109 Type 1 diabetes mellitus without complications: Secondary | ICD-10-CM | POA: Diagnosis not present

## 2021-05-26 DIAGNOSIS — Z0000 Encounter for general adult medical examination without abnormal findings: Secondary | ICD-10-CM | POA: Diagnosis not present

## 2021-05-27 ENCOUNTER — Other Ambulatory Visit: Payer: Self-pay | Admitting: Registered Nurse

## 2021-05-27 DIAGNOSIS — F17200 Nicotine dependence, unspecified, uncomplicated: Secondary | ICD-10-CM

## 2021-06-03 DIAGNOSIS — M810 Age-related osteoporosis without current pathological fracture: Secondary | ICD-10-CM | POA: Diagnosis not present

## 2021-06-23 ENCOUNTER — Other Ambulatory Visit: Payer: Self-pay | Admitting: Internal Medicine

## 2021-06-23 DIAGNOSIS — Z1231 Encounter for screening mammogram for malignant neoplasm of breast: Secondary | ICD-10-CM

## 2021-07-15 ENCOUNTER — Inpatient Hospital Stay: Admission: RE | Admit: 2021-07-15 | Payer: Medicare Other | Source: Ambulatory Visit

## 2021-07-27 ENCOUNTER — Ambulatory Visit
Admission: RE | Admit: 2021-07-27 | Discharge: 2021-07-27 | Disposition: A | Discharge status: Home / Self Care | Payer: Medicare Other | Source: Ambulatory Visit

## 2021-07-27 DIAGNOSIS — Z1231 Encounter for screening mammogram for malignant neoplasm of breast: Secondary | ICD-10-CM | POA: Diagnosis not present

## 2021-07-30 DIAGNOSIS — E7800 Pure hypercholesterolemia, unspecified: Secondary | ICD-10-CM | POA: Diagnosis not present

## 2021-07-30 DIAGNOSIS — E039 Hypothyroidism, unspecified: Secondary | ICD-10-CM | POA: Diagnosis not present

## 2021-07-30 DIAGNOSIS — E1069 Type 1 diabetes mellitus with other specified complication: Secondary | ICD-10-CM | POA: Diagnosis not present

## 2021-07-30 DIAGNOSIS — M810 Age-related osteoporosis without current pathological fracture: Secondary | ICD-10-CM | POA: Diagnosis not present

## 2021-08-06 ENCOUNTER — Ambulatory Visit
Admission: RE | Admit: 2021-08-06 | Discharge: 2021-08-06 | Disposition: A | Discharge status: Home / Self Care | Payer: Medicare Other | Source: Ambulatory Visit | Attending: Registered Nurse | Admitting: Registered Nurse

## 2021-08-06 ENCOUNTER — Other Ambulatory Visit: Payer: Self-pay

## 2021-08-06 DIAGNOSIS — Z87891 Personal history of nicotine dependence: Secondary | ICD-10-CM | POA: Diagnosis not present

## 2021-08-06 DIAGNOSIS — F17200 Nicotine dependence, unspecified, uncomplicated: Secondary | ICD-10-CM

## 2021-08-06 DIAGNOSIS — E039 Hypothyroidism, unspecified: Secondary | ICD-10-CM | POA: Diagnosis not present

## 2021-08-06 DIAGNOSIS — Z9641 Presence of insulin pump (external) (internal): Secondary | ICD-10-CM | POA: Diagnosis not present

## 2021-08-06 DIAGNOSIS — E1069 Type 1 diabetes mellitus with other specified complication: Secondary | ICD-10-CM | POA: Diagnosis not present

## 2021-08-06 DIAGNOSIS — I2510 Atherosclerotic heart disease of native coronary artery without angina pectoris: Secondary | ICD-10-CM | POA: Diagnosis not present

## 2021-08-06 DIAGNOSIS — M810 Age-related osteoporosis without current pathological fracture: Secondary | ICD-10-CM | POA: Diagnosis not present

## 2021-08-06 DIAGNOSIS — E7800 Pure hypercholesterolemia, unspecified: Secondary | ICD-10-CM | POA: Diagnosis not present

## 2021-08-06 DIAGNOSIS — J439 Emphysema, unspecified: Secondary | ICD-10-CM | POA: Diagnosis not present

## 2021-08-06 DIAGNOSIS — N2889 Other specified disorders of kidney and ureter: Secondary | ICD-10-CM | POA: Diagnosis not present

## 2021-10-29 DIAGNOSIS — E559 Vitamin D deficiency, unspecified: Secondary | ICD-10-CM | POA: Diagnosis not present

## 2021-10-29 DIAGNOSIS — M810 Age-related osteoporosis without current pathological fracture: Secondary | ICD-10-CM | POA: Diagnosis not present

## 2021-10-29 DIAGNOSIS — E7800 Pure hypercholesterolemia, unspecified: Secondary | ICD-10-CM | POA: Diagnosis not present

## 2021-10-29 DIAGNOSIS — E1069 Type 1 diabetes mellitus with other specified complication: Secondary | ICD-10-CM | POA: Diagnosis not present

## 2021-11-04 DIAGNOSIS — E039 Hypothyroidism, unspecified: Secondary | ICD-10-CM | POA: Diagnosis not present

## 2021-11-04 DIAGNOSIS — I10 Essential (primary) hypertension: Secondary | ICD-10-CM | POA: Diagnosis not present

## 2021-11-04 DIAGNOSIS — E1069 Type 1 diabetes mellitus with other specified complication: Secondary | ICD-10-CM | POA: Diagnosis not present

## 2021-11-04 DIAGNOSIS — E7800 Pure hypercholesterolemia, unspecified: Secondary | ICD-10-CM | POA: Diagnosis not present

## 2021-11-04 DIAGNOSIS — Z23 Encounter for immunization: Secondary | ICD-10-CM | POA: Diagnosis not present

## 2021-11-04 DIAGNOSIS — Z9641 Presence of insulin pump (external) (internal): Secondary | ICD-10-CM | POA: Diagnosis not present

## 2021-12-30 DIAGNOSIS — E109 Type 1 diabetes mellitus without complications: Secondary | ICD-10-CM | POA: Diagnosis not present

## 2021-12-30 DIAGNOSIS — E559 Vitamin D deficiency, unspecified: Secondary | ICD-10-CM | POA: Diagnosis not present

## 2021-12-30 DIAGNOSIS — Z7901 Long term (current) use of anticoagulants: Secondary | ICD-10-CM | POA: Diagnosis not present

## 2021-12-30 DIAGNOSIS — E039 Hypothyroidism, unspecified: Secondary | ICD-10-CM | POA: Diagnosis not present

## 2021-12-30 DIAGNOSIS — E7800 Pure hypercholesterolemia, unspecified: Secondary | ICD-10-CM | POA: Diagnosis not present

## 2021-12-30 DIAGNOSIS — I82501 Chronic embolism and thrombosis of unspecified deep veins of right lower extremity: Secondary | ICD-10-CM | POA: Diagnosis not present

## 2021-12-30 DIAGNOSIS — K219 Gastro-esophageal reflux disease without esophagitis: Secondary | ICD-10-CM | POA: Diagnosis not present

## 2021-12-30 DIAGNOSIS — I10 Essential (primary) hypertension: Secondary | ICD-10-CM | POA: Diagnosis not present

## 2022-01-13 DIAGNOSIS — I10 Essential (primary) hypertension: Secondary | ICD-10-CM | POA: Diagnosis not present

## 2022-01-27 DIAGNOSIS — E039 Hypothyroidism, unspecified: Secondary | ICD-10-CM | POA: Diagnosis not present

## 2022-01-27 DIAGNOSIS — E109 Type 1 diabetes mellitus without complications: Secondary | ICD-10-CM | POA: Diagnosis not present

## 2022-01-27 DIAGNOSIS — E7800 Pure hypercholesterolemia, unspecified: Secondary | ICD-10-CM | POA: Diagnosis not present

## 2022-02-03 DIAGNOSIS — I10 Essential (primary) hypertension: Secondary | ICD-10-CM | POA: Diagnosis not present

## 2022-02-03 DIAGNOSIS — E1140 Type 2 diabetes mellitus with diabetic neuropathy, unspecified: Secondary | ICD-10-CM | POA: Diagnosis not present

## 2022-02-03 DIAGNOSIS — E039 Hypothyroidism, unspecified: Secondary | ICD-10-CM | POA: Diagnosis not present

## 2022-02-03 DIAGNOSIS — E1069 Type 1 diabetes mellitus with other specified complication: Secondary | ICD-10-CM | POA: Diagnosis not present

## 2022-02-03 DIAGNOSIS — Z9641 Presence of insulin pump (external) (internal): Secondary | ICD-10-CM | POA: Diagnosis not present

## 2022-02-18 DIAGNOSIS — B351 Tinea unguium: Secondary | ICD-10-CM | POA: Diagnosis not present

## 2022-02-18 DIAGNOSIS — M2011 Hallux valgus (acquired), right foot: Secondary | ICD-10-CM | POA: Diagnosis not present

## 2022-02-18 DIAGNOSIS — E119 Type 2 diabetes mellitus without complications: Secondary | ICD-10-CM | POA: Diagnosis not present

## 2022-04-16 DIAGNOSIS — Z23 Encounter for immunization: Secondary | ICD-10-CM | POA: Diagnosis not present

## 2022-04-27 DIAGNOSIS — E1069 Type 1 diabetes mellitus with other specified complication: Secondary | ICD-10-CM | POA: Diagnosis not present

## 2022-04-27 DIAGNOSIS — E039 Hypothyroidism, unspecified: Secondary | ICD-10-CM | POA: Diagnosis not present

## 2022-04-27 DIAGNOSIS — E7800 Pure hypercholesterolemia, unspecified: Secondary | ICD-10-CM | POA: Diagnosis not present

## 2022-04-27 DIAGNOSIS — M810 Age-related osteoporosis without current pathological fracture: Secondary | ICD-10-CM | POA: Diagnosis not present

## 2022-05-06 DIAGNOSIS — E7800 Pure hypercholesterolemia, unspecified: Secondary | ICD-10-CM | POA: Diagnosis not present

## 2022-05-06 DIAGNOSIS — I10 Essential (primary) hypertension: Secondary | ICD-10-CM | POA: Diagnosis not present

## 2022-05-06 DIAGNOSIS — M810 Age-related osteoporosis without current pathological fracture: Secondary | ICD-10-CM | POA: Diagnosis not present

## 2022-05-06 DIAGNOSIS — E1069 Type 1 diabetes mellitus with other specified complication: Secondary | ICD-10-CM | POA: Diagnosis not present

## 2022-05-06 DIAGNOSIS — E039 Hypothyroidism, unspecified: Secondary | ICD-10-CM | POA: Diagnosis not present

## 2022-05-06 DIAGNOSIS — Z9641 Presence of insulin pump (external) (internal): Secondary | ICD-10-CM | POA: Diagnosis not present

## 2022-05-26 DIAGNOSIS — M2011 Hallux valgus (acquired), right foot: Secondary | ICD-10-CM | POA: Diagnosis not present

## 2022-05-26 DIAGNOSIS — B351 Tinea unguium: Secondary | ICD-10-CM | POA: Diagnosis not present

## 2022-05-26 DIAGNOSIS — E119 Type 2 diabetes mellitus without complications: Secondary | ICD-10-CM | POA: Diagnosis not present

## 2022-05-26 DIAGNOSIS — R2689 Other abnormalities of gait and mobility: Secondary | ICD-10-CM | POA: Diagnosis not present

## 2022-05-27 DIAGNOSIS — E1069 Type 1 diabetes mellitus with other specified complication: Secondary | ICD-10-CM | POA: Diagnosis not present

## 2022-05-27 DIAGNOSIS — D72829 Elevated white blood cell count, unspecified: Secondary | ICD-10-CM | POA: Diagnosis not present

## 2022-08-04 DIAGNOSIS — E7800 Pure hypercholesterolemia, unspecified: Secondary | ICD-10-CM | POA: Diagnosis not present

## 2022-08-04 DIAGNOSIS — R5383 Other fatigue: Secondary | ICD-10-CM | POA: Diagnosis not present

## 2022-08-04 DIAGNOSIS — I10 Essential (primary) hypertension: Secondary | ICD-10-CM | POA: Diagnosis not present

## 2022-08-04 DIAGNOSIS — E1069 Type 1 diabetes mellitus with other specified complication: Secondary | ICD-10-CM | POA: Diagnosis not present

## 2022-08-05 DIAGNOSIS — E119 Type 2 diabetes mellitus without complications: Secondary | ICD-10-CM | POA: Diagnosis not present

## 2022-08-05 DIAGNOSIS — B351 Tinea unguium: Secondary | ICD-10-CM | POA: Diagnosis not present

## 2022-08-05 DIAGNOSIS — M2011 Hallux valgus (acquired), right foot: Secondary | ICD-10-CM | POA: Diagnosis not present

## 2022-08-05 DIAGNOSIS — R2689 Other abnormalities of gait and mobility: Secondary | ICD-10-CM | POA: Diagnosis not present

## 2022-08-09 DIAGNOSIS — E1069 Type 1 diabetes mellitus with other specified complication: Secondary | ICD-10-CM | POA: Diagnosis not present

## 2022-08-09 DIAGNOSIS — E039 Hypothyroidism, unspecified: Secondary | ICD-10-CM | POA: Diagnosis not present

## 2022-08-09 DIAGNOSIS — K3184 Gastroparesis: Secondary | ICD-10-CM | POA: Diagnosis not present

## 2022-08-09 DIAGNOSIS — Z9641 Presence of insulin pump (external) (internal): Secondary | ICD-10-CM | POA: Diagnosis not present

## 2022-08-09 DIAGNOSIS — K219 Gastro-esophageal reflux disease without esophagitis: Secondary | ICD-10-CM | POA: Diagnosis not present

## 2022-08-09 DIAGNOSIS — E7800 Pure hypercholesterolemia, unspecified: Secondary | ICD-10-CM | POA: Diagnosis not present

## 2022-08-09 DIAGNOSIS — I10 Essential (primary) hypertension: Secondary | ICD-10-CM | POA: Diagnosis not present

## 2022-08-09 DIAGNOSIS — R140 Abdominal distension (gaseous): Secondary | ICD-10-CM | POA: Diagnosis not present

## 2022-08-09 DIAGNOSIS — Z1211 Encounter for screening for malignant neoplasm of colon: Secondary | ICD-10-CM | POA: Diagnosis not present

## 2022-08-09 DIAGNOSIS — E1140 Type 2 diabetes mellitus with diabetic neuropathy, unspecified: Secondary | ICD-10-CM | POA: Diagnosis not present

## 2022-08-09 DIAGNOSIS — M810 Age-related osteoporosis without current pathological fracture: Secondary | ICD-10-CM | POA: Diagnosis not present

## 2022-08-17 DIAGNOSIS — M810 Age-related osteoporosis without current pathological fracture: Secondary | ICD-10-CM | POA: Diagnosis not present

## 2022-09-08 DIAGNOSIS — E7800 Pure hypercholesterolemia, unspecified: Secondary | ICD-10-CM | POA: Diagnosis not present

## 2022-09-08 DIAGNOSIS — Z794 Long term (current) use of insulin: Secondary | ICD-10-CM | POA: Diagnosis not present

## 2022-09-08 DIAGNOSIS — I10 Essential (primary) hypertension: Secondary | ICD-10-CM | POA: Diagnosis not present

## 2022-09-08 DIAGNOSIS — E039 Hypothyroidism, unspecified: Secondary | ICD-10-CM | POA: Diagnosis not present

## 2022-09-08 DIAGNOSIS — Z9641 Presence of insulin pump (external) (internal): Secondary | ICD-10-CM | POA: Diagnosis not present

## 2022-09-08 DIAGNOSIS — M810 Age-related osteoporosis without current pathological fracture: Secondary | ICD-10-CM | POA: Diagnosis not present

## 2022-09-08 DIAGNOSIS — N182 Chronic kidney disease, stage 2 (mild): Secondary | ICD-10-CM | POA: Diagnosis not present

## 2022-09-08 DIAGNOSIS — Z0000 Encounter for general adult medical examination without abnormal findings: Secondary | ICD-10-CM | POA: Diagnosis not present

## 2022-09-08 DIAGNOSIS — E1069 Type 1 diabetes mellitus with other specified complication: Secondary | ICD-10-CM | POA: Diagnosis not present

## 2022-11-02 DIAGNOSIS — E756 Lipid storage disorder, unspecified: Secondary | ICD-10-CM | POA: Diagnosis not present

## 2022-11-02 DIAGNOSIS — I10 Essential (primary) hypertension: Secondary | ICD-10-CM | POA: Diagnosis not present

## 2022-11-02 DIAGNOSIS — E039 Hypothyroidism, unspecified: Secondary | ICD-10-CM | POA: Diagnosis not present

## 2022-11-02 DIAGNOSIS — M810 Age-related osteoporosis without current pathological fracture: Secondary | ICD-10-CM | POA: Diagnosis not present

## 2022-11-02 DIAGNOSIS — E1069 Type 1 diabetes mellitus with other specified complication: Secondary | ICD-10-CM | POA: Diagnosis not present

## 2022-11-08 DIAGNOSIS — R801 Persistent proteinuria, unspecified: Secondary | ICD-10-CM | POA: Diagnosis not present

## 2022-11-08 DIAGNOSIS — E7800 Pure hypercholesterolemia, unspecified: Secondary | ICD-10-CM | POA: Diagnosis not present

## 2022-11-08 DIAGNOSIS — I10 Essential (primary) hypertension: Secondary | ICD-10-CM | POA: Diagnosis not present

## 2022-11-08 DIAGNOSIS — E1069 Type 1 diabetes mellitus with other specified complication: Secondary | ICD-10-CM | POA: Diagnosis not present

## 2022-11-08 DIAGNOSIS — E039 Hypothyroidism, unspecified: Secondary | ICD-10-CM | POA: Diagnosis not present

## 2022-11-08 DIAGNOSIS — M810 Age-related osteoporosis without current pathological fracture: Secondary | ICD-10-CM | POA: Diagnosis not present

## 2022-11-08 DIAGNOSIS — E756 Lipid storage disorder, unspecified: Secondary | ICD-10-CM | POA: Diagnosis not present

## 2022-11-08 DIAGNOSIS — Z9641 Presence of insulin pump (external) (internal): Secondary | ICD-10-CM | POA: Diagnosis not present

## 2022-11-18 DIAGNOSIS — B351 Tinea unguium: Secondary | ICD-10-CM | POA: Diagnosis not present

## 2022-11-18 DIAGNOSIS — M2011 Hallux valgus (acquired), right foot: Secondary | ICD-10-CM | POA: Diagnosis not present

## 2022-11-18 DIAGNOSIS — M79675 Pain in left toe(s): Secondary | ICD-10-CM | POA: Diagnosis not present

## 2022-11-18 DIAGNOSIS — R2689 Other abnormalities of gait and mobility: Secondary | ICD-10-CM | POA: Diagnosis not present

## 2022-11-18 DIAGNOSIS — E119 Type 2 diabetes mellitus without complications: Secondary | ICD-10-CM | POA: Diagnosis not present

## 2023-02-03 DIAGNOSIS — R2689 Other abnormalities of gait and mobility: Secondary | ICD-10-CM | POA: Diagnosis not present

## 2023-02-03 DIAGNOSIS — M79675 Pain in left toe(s): Secondary | ICD-10-CM | POA: Diagnosis not present

## 2023-02-03 DIAGNOSIS — B351 Tinea unguium: Secondary | ICD-10-CM | POA: Diagnosis not present

## 2023-02-03 DIAGNOSIS — E119 Type 2 diabetes mellitus without complications: Secondary | ICD-10-CM | POA: Diagnosis not present

## 2023-02-03 DIAGNOSIS — M2011 Hallux valgus (acquired), right foot: Secondary | ICD-10-CM | POA: Diagnosis not present

## 2023-02-07 DIAGNOSIS — E039 Hypothyroidism, unspecified: Secondary | ICD-10-CM | POA: Diagnosis not present

## 2023-02-07 DIAGNOSIS — N182 Chronic kidney disease, stage 2 (mild): Secondary | ICD-10-CM | POA: Diagnosis not present

## 2023-02-07 DIAGNOSIS — Z794 Long term (current) use of insulin: Secondary | ICD-10-CM | POA: Diagnosis not present

## 2023-02-07 DIAGNOSIS — E1069 Type 1 diabetes mellitus with other specified complication: Secondary | ICD-10-CM | POA: Diagnosis not present

## 2023-02-07 DIAGNOSIS — Z9641 Presence of insulin pump (external) (internal): Secondary | ICD-10-CM | POA: Diagnosis not present

## 2023-02-07 DIAGNOSIS — E756 Lipid storage disorder, unspecified: Secondary | ICD-10-CM | POA: Diagnosis not present

## 2023-02-07 DIAGNOSIS — R801 Persistent proteinuria, unspecified: Secondary | ICD-10-CM | POA: Diagnosis not present

## 2023-02-07 DIAGNOSIS — E7800 Pure hypercholesterolemia, unspecified: Secondary | ICD-10-CM | POA: Diagnosis not present

## 2023-02-07 DIAGNOSIS — I10 Essential (primary) hypertension: Secondary | ICD-10-CM | POA: Diagnosis not present

## 2023-02-07 DIAGNOSIS — M810 Age-related osteoporosis without current pathological fracture: Secondary | ICD-10-CM | POA: Diagnosis not present

## 2023-02-14 DIAGNOSIS — I10 Essential (primary) hypertension: Secondary | ICD-10-CM | POA: Diagnosis not present

## 2023-02-14 DIAGNOSIS — M810 Age-related osteoporosis without current pathological fracture: Secondary | ICD-10-CM | POA: Diagnosis not present

## 2023-02-14 DIAGNOSIS — Z794 Long term (current) use of insulin: Secondary | ICD-10-CM | POA: Diagnosis not present

## 2023-02-14 DIAGNOSIS — E109 Type 1 diabetes mellitus without complications: Secondary | ICD-10-CM | POA: Diagnosis not present

## 2023-02-14 DIAGNOSIS — E039 Hypothyroidism, unspecified: Secondary | ICD-10-CM | POA: Diagnosis not present

## 2023-02-14 DIAGNOSIS — Z9641 Presence of insulin pump (external) (internal): Secondary | ICD-10-CM | POA: Diagnosis not present

## 2023-02-14 DIAGNOSIS — E7800 Pure hypercholesterolemia, unspecified: Secondary | ICD-10-CM | POA: Diagnosis not present

## 2023-02-14 DIAGNOSIS — E1140 Type 2 diabetes mellitus with diabetic neuropathy, unspecified: Secondary | ICD-10-CM | POA: Diagnosis not present

## 2023-02-14 DIAGNOSIS — E1069 Type 1 diabetes mellitus with other specified complication: Secondary | ICD-10-CM | POA: Diagnosis not present

## 2023-03-24 DIAGNOSIS — Z23 Encounter for immunization: Secondary | ICD-10-CM | POA: Diagnosis not present

## 2023-04-20 DIAGNOSIS — B353 Tinea pedis: Secondary | ICD-10-CM | POA: Diagnosis not present

## 2023-04-20 DIAGNOSIS — M79675 Pain in left toe(s): Secondary | ICD-10-CM | POA: Diagnosis not present

## 2023-04-20 DIAGNOSIS — B351 Tinea unguium: Secondary | ICD-10-CM | POA: Diagnosis not present

## 2023-04-20 DIAGNOSIS — E119 Type 2 diabetes mellitus without complications: Secondary | ICD-10-CM | POA: Diagnosis not present

## 2023-04-20 DIAGNOSIS — M2042 Other hammer toe(s) (acquired), left foot: Secondary | ICD-10-CM | POA: Diagnosis not present

## 2023-04-21 DIAGNOSIS — Z23 Encounter for immunization: Secondary | ICD-10-CM | POA: Diagnosis not present

## 2023-04-22 DIAGNOSIS — L608 Other nail disorders: Secondary | ICD-10-CM | POA: Diagnosis not present

## 2023-05-03 DIAGNOSIS — E039 Hypothyroidism, unspecified: Secondary | ICD-10-CM | POA: Diagnosis not present

## 2023-05-03 DIAGNOSIS — M810 Age-related osteoporosis without current pathological fracture: Secondary | ICD-10-CM | POA: Diagnosis not present

## 2023-05-03 DIAGNOSIS — E7800 Pure hypercholesterolemia, unspecified: Secondary | ICD-10-CM | POA: Diagnosis not present

## 2023-05-03 DIAGNOSIS — E1069 Type 1 diabetes mellitus with other specified complication: Secondary | ICD-10-CM | POA: Diagnosis not present

## 2023-05-03 DIAGNOSIS — Z9641 Presence of insulin pump (external) (internal): Secondary | ICD-10-CM | POA: Diagnosis not present

## 2023-05-03 DIAGNOSIS — I10 Essential (primary) hypertension: Secondary | ICD-10-CM | POA: Diagnosis not present

## 2023-05-03 DIAGNOSIS — N182 Chronic kidney disease, stage 2 (mild): Secondary | ICD-10-CM | POA: Diagnosis not present

## 2023-05-03 DIAGNOSIS — Z794 Long term (current) use of insulin: Secondary | ICD-10-CM | POA: Diagnosis not present

## 2023-05-10 DIAGNOSIS — I10 Essential (primary) hypertension: Secondary | ICD-10-CM | POA: Diagnosis not present

## 2023-05-10 DIAGNOSIS — Z9641 Presence of insulin pump (external) (internal): Secondary | ICD-10-CM | POA: Diagnosis not present

## 2023-05-10 DIAGNOSIS — E7800 Pure hypercholesterolemia, unspecified: Secondary | ICD-10-CM | POA: Diagnosis not present

## 2023-05-10 DIAGNOSIS — E1069 Type 1 diabetes mellitus with other specified complication: Secondary | ICD-10-CM | POA: Diagnosis not present

## 2023-05-10 DIAGNOSIS — E039 Hypothyroidism, unspecified: Secondary | ICD-10-CM | POA: Diagnosis not present

## 2023-05-27 DIAGNOSIS — L2989 Other pruritus: Secondary | ICD-10-CM | POA: Diagnosis not present

## 2023-05-27 DIAGNOSIS — D225 Melanocytic nevi of trunk: Secondary | ICD-10-CM | POA: Diagnosis not present

## 2023-05-27 DIAGNOSIS — Z1283 Encounter for screening for malignant neoplasm of skin: Secondary | ICD-10-CM | POA: Diagnosis not present

## 2023-05-27 DIAGNOSIS — L820 Inflamed seborrheic keratosis: Secondary | ICD-10-CM | POA: Diagnosis not present

## 2023-05-27 DIAGNOSIS — L281 Prurigo nodularis: Secondary | ICD-10-CM | POA: Diagnosis not present

## 2023-06-30 DIAGNOSIS — R2689 Other abnormalities of gait and mobility: Secondary | ICD-10-CM | POA: Diagnosis not present

## 2023-06-30 DIAGNOSIS — M2011 Hallux valgus (acquired), right foot: Secondary | ICD-10-CM | POA: Diagnosis not present

## 2023-06-30 DIAGNOSIS — E119 Type 2 diabetes mellitus without complications: Secondary | ICD-10-CM | POA: Diagnosis not present

## 2023-06-30 DIAGNOSIS — M79675 Pain in left toe(s): Secondary | ICD-10-CM | POA: Diagnosis not present

## 2023-06-30 DIAGNOSIS — B351 Tinea unguium: Secondary | ICD-10-CM | POA: Diagnosis not present

## 2023-07-08 DIAGNOSIS — M5416 Radiculopathy, lumbar region: Secondary | ICD-10-CM | POA: Diagnosis not present

## 2023-07-08 DIAGNOSIS — Z6829 Body mass index (BMI) 29.0-29.9, adult: Secondary | ICD-10-CM | POA: Diagnosis not present

## 2023-08-03 ENCOUNTER — Other Ambulatory Visit: Payer: Self-pay | Admitting: Neurosurgery

## 2023-08-03 ENCOUNTER — Encounter: Payer: Self-pay | Admitting: Neurosurgery

## 2023-08-03 DIAGNOSIS — E039 Hypothyroidism, unspecified: Secondary | ICD-10-CM | POA: Diagnosis not present

## 2023-08-03 DIAGNOSIS — M5416 Radiculopathy, lumbar region: Secondary | ICD-10-CM

## 2023-08-10 DIAGNOSIS — M810 Age-related osteoporosis without current pathological fracture: Secondary | ICD-10-CM | POA: Diagnosis not present

## 2023-08-10 DIAGNOSIS — E039 Hypothyroidism, unspecified: Secondary | ICD-10-CM | POA: Diagnosis not present

## 2023-08-10 DIAGNOSIS — Z794 Long term (current) use of insulin: Secondary | ICD-10-CM | POA: Diagnosis not present

## 2023-08-10 DIAGNOSIS — K3184 Gastroparesis: Secondary | ICD-10-CM | POA: Diagnosis not present

## 2023-08-10 DIAGNOSIS — Z9641 Presence of insulin pump (external) (internal): Secondary | ICD-10-CM | POA: Diagnosis not present

## 2023-08-10 DIAGNOSIS — E1140 Type 2 diabetes mellitus with diabetic neuropathy, unspecified: Secondary | ICD-10-CM | POA: Diagnosis not present

## 2023-08-10 DIAGNOSIS — I10 Essential (primary) hypertension: Secondary | ICD-10-CM | POA: Diagnosis not present

## 2023-08-10 DIAGNOSIS — E559 Vitamin D deficiency, unspecified: Secondary | ICD-10-CM | POA: Diagnosis not present

## 2023-08-10 DIAGNOSIS — E1069 Type 1 diabetes mellitus with other specified complication: Secondary | ICD-10-CM | POA: Diagnosis not present

## 2023-09-07 DIAGNOSIS — Z9641 Presence of insulin pump (external) (internal): Secondary | ICD-10-CM | POA: Diagnosis not present

## 2023-09-07 DIAGNOSIS — N182 Chronic kidney disease, stage 2 (mild): Secondary | ICD-10-CM | POA: Diagnosis not present

## 2023-09-07 DIAGNOSIS — I10 Essential (primary) hypertension: Secondary | ICD-10-CM | POA: Diagnosis not present

## 2023-09-07 DIAGNOSIS — Z794 Long term (current) use of insulin: Secondary | ICD-10-CM | POA: Diagnosis not present

## 2023-09-07 DIAGNOSIS — E1069 Type 1 diabetes mellitus with other specified complication: Secondary | ICD-10-CM | POA: Diagnosis not present

## 2023-09-07 DIAGNOSIS — E039 Hypothyroidism, unspecified: Secondary | ICD-10-CM | POA: Diagnosis not present

## 2023-09-07 DIAGNOSIS — M810 Age-related osteoporosis without current pathological fracture: Secondary | ICD-10-CM | POA: Diagnosis not present

## 2023-09-07 DIAGNOSIS — E7800 Pure hypercholesterolemia, unspecified: Secondary | ICD-10-CM | POA: Diagnosis not present

## 2023-09-12 ENCOUNTER — Other Ambulatory Visit: Payer: Self-pay | Admitting: Surgery

## 2023-09-12 DIAGNOSIS — M4322 Fusion of spine, cervical region: Secondary | ICD-10-CM

## 2023-09-14 ENCOUNTER — Ambulatory Visit
Admission: RE | Admit: 2023-09-14 | Discharge: 2023-09-14 | Disposition: A | Discharge status: Home / Self Care | Payer: Medicare Other | Source: Ambulatory Visit | Attending: Neurosurgery | Admitting: Neurosurgery

## 2023-09-14 DIAGNOSIS — N182 Chronic kidney disease, stage 2 (mild): Secondary | ICD-10-CM | POA: Diagnosis not present

## 2023-09-14 DIAGNOSIS — M5126 Other intervertebral disc displacement, lumbar region: Secondary | ICD-10-CM | POA: Diagnosis not present

## 2023-09-14 DIAGNOSIS — M810 Age-related osteoporosis without current pathological fracture: Secondary | ICD-10-CM | POA: Diagnosis not present

## 2023-09-14 DIAGNOSIS — M48061 Spinal stenosis, lumbar region without neurogenic claudication: Secondary | ICD-10-CM | POA: Diagnosis not present

## 2023-09-14 DIAGNOSIS — Z9641 Presence of insulin pump (external) (internal): Secondary | ICD-10-CM | POA: Diagnosis not present

## 2023-09-14 DIAGNOSIS — I10 Essential (primary) hypertension: Secondary | ICD-10-CM | POA: Diagnosis not present

## 2023-09-14 DIAGNOSIS — E039 Hypothyroidism, unspecified: Secondary | ICD-10-CM | POA: Diagnosis not present

## 2023-09-14 DIAGNOSIS — Z0000 Encounter for general adult medical examination without abnormal findings: Secondary | ICD-10-CM | POA: Diagnosis not present

## 2023-09-14 DIAGNOSIS — N63 Unspecified lump in unspecified breast: Secondary | ICD-10-CM | POA: Diagnosis not present

## 2023-09-14 DIAGNOSIS — E1069 Type 1 diabetes mellitus with other specified complication: Secondary | ICD-10-CM | POA: Diagnosis not present

## 2023-09-14 DIAGNOSIS — J410 Simple chronic bronchitis: Secondary | ICD-10-CM | POA: Diagnosis not present

## 2023-09-14 DIAGNOSIS — M5416 Radiculopathy, lumbar region: Secondary | ICD-10-CM

## 2023-09-14 DIAGNOSIS — M47816 Spondylosis without myelopathy or radiculopathy, lumbar region: Secondary | ICD-10-CM | POA: Diagnosis not present

## 2023-09-14 DIAGNOSIS — R072 Precordial pain: Secondary | ICD-10-CM | POA: Diagnosis not present

## 2023-09-14 DIAGNOSIS — Z794 Long term (current) use of insulin: Secondary | ICD-10-CM | POA: Diagnosis not present

## 2023-09-14 DIAGNOSIS — E7800 Pure hypercholesterolemia, unspecified: Secondary | ICD-10-CM | POA: Diagnosis not present

## 2023-09-15 DIAGNOSIS — B351 Tinea unguium: Secondary | ICD-10-CM | POA: Diagnosis not present

## 2023-09-15 DIAGNOSIS — R2689 Other abnormalities of gait and mobility: Secondary | ICD-10-CM | POA: Diagnosis not present

## 2023-09-15 DIAGNOSIS — M79675 Pain in left toe(s): Secondary | ICD-10-CM | POA: Diagnosis not present

## 2023-09-15 DIAGNOSIS — E119 Type 2 diabetes mellitus without complications: Secondary | ICD-10-CM | POA: Diagnosis not present

## 2023-09-15 DIAGNOSIS — M2011 Hallux valgus (acquired), right foot: Secondary | ICD-10-CM | POA: Diagnosis not present

## 2023-09-27 ENCOUNTER — Ambulatory Visit
Admission: RE | Admit: 2023-09-27 | Discharge: 2023-09-27 | Disposition: A | Discharge status: Home / Self Care | Payer: Medicare Other | Source: Ambulatory Visit | Attending: Surgery | Admitting: Surgery

## 2023-09-27 DIAGNOSIS — M4802 Spinal stenosis, cervical region: Secondary | ICD-10-CM | POA: Diagnosis not present

## 2023-09-27 DIAGNOSIS — M4322 Fusion of spine, cervical region: Secondary | ICD-10-CM | POA: Diagnosis not present

## 2023-09-27 DIAGNOSIS — M542 Cervicalgia: Secondary | ICD-10-CM | POA: Diagnosis not present

## 2023-10-10 DIAGNOSIS — M4322 Fusion of spine, cervical region: Secondary | ICD-10-CM | POA: Diagnosis not present

## 2023-10-10 DIAGNOSIS — M4802 Spinal stenosis, cervical region: Secondary | ICD-10-CM | POA: Diagnosis not present

## 2023-10-10 DIAGNOSIS — Z6829 Body mass index (BMI) 29.0-29.9, adult: Secondary | ICD-10-CM | POA: Diagnosis not present

## 2023-10-12 ENCOUNTER — Other Ambulatory Visit: Payer: Self-pay | Admitting: Internal Medicine

## 2023-10-12 DIAGNOSIS — N63 Unspecified lump in unspecified breast: Secondary | ICD-10-CM

## 2023-10-20 ENCOUNTER — Other Ambulatory Visit: Payer: Self-pay

## 2023-10-20 ENCOUNTER — Emergency Department (HOSPITAL_COMMUNITY)

## 2023-10-20 ENCOUNTER — Emergency Department (HOSPITAL_COMMUNITY)
Admission: EM | Admit: 2023-10-20 | Discharge: 2023-10-20 | Disposition: A | Discharge status: Home / Self Care | Attending: Emergency Medicine | Admitting: Emergency Medicine

## 2023-10-20 DIAGNOSIS — R1031 Right lower quadrant pain: Secondary | ICD-10-CM | POA: Diagnosis not present

## 2023-10-20 DIAGNOSIS — E1140 Type 2 diabetes mellitus with diabetic neuropathy, unspecified: Secondary | ICD-10-CM | POA: Diagnosis not present

## 2023-10-20 DIAGNOSIS — R109 Unspecified abdominal pain: Secondary | ICD-10-CM | POA: Diagnosis not present

## 2023-10-20 DIAGNOSIS — Z85828 Personal history of other malignant neoplasm of skin: Secondary | ICD-10-CM | POA: Insufficient documentation

## 2023-10-20 DIAGNOSIS — N3000 Acute cystitis without hematuria: Secondary | ICD-10-CM

## 2023-10-20 DIAGNOSIS — I10 Essential (primary) hypertension: Secondary | ICD-10-CM | POA: Diagnosis not present

## 2023-10-20 DIAGNOSIS — R10A0 Flank pain, unspecified side: Secondary | ICD-10-CM

## 2023-10-20 LAB — CBC
HCT: 45.9 % (ref 36.0–46.0)
Hemoglobin: 15.4 g/dL — ABNORMAL HIGH (ref 12.0–15.0)
MCH: 31 pg (ref 26.0–34.0)
MCHC: 33.6 g/dL (ref 30.0–36.0)
MCV: 92.5 fL (ref 80.0–100.0)
Platelets: 325 10*3/uL (ref 150–400)
RBC: 4.96 MIL/uL (ref 3.87–5.11)
RDW: 12.5 % (ref 11.5–15.5)
WBC: 9.3 10*3/uL (ref 4.0–10.5)
nRBC: 0 % (ref 0.0–0.2)

## 2023-10-20 LAB — URINALYSIS, ROUTINE W REFLEX MICROSCOPIC
Bilirubin Urine: NEGATIVE
Glucose, UA: NEGATIVE mg/dL
Hgb urine dipstick: NEGATIVE
Ketones, ur: NEGATIVE mg/dL
Nitrite: NEGATIVE
Protein, ur: NEGATIVE mg/dL
Specific Gravity, Urine: <1.005 — ABNORMAL LOW (ref 1.005–1.030)
pH: 6 (ref 5.0–8.0)

## 2023-10-20 LAB — COMPREHENSIVE METABOLIC PANEL WITH GFR
ALT: 9 U/L (ref 0–44)
AST: 22 U/L (ref 15–41)
Albumin: 4.2 g/dL (ref 3.5–5.0)
Alkaline Phosphatase: 52 U/L (ref 38–126)
Anion gap: 10 (ref 5–15)
BUN: 16 mg/dL (ref 8–23)
CO2: 21 mmol/L — ABNORMAL LOW (ref 22–32)
Calcium: 9.8 mg/dL (ref 8.9–10.3)
Chloride: 105 mmol/L (ref 98–111)
Creatinine, Ser: 1.13 mg/dL — ABNORMAL HIGH (ref 0.44–1.00)
GFR, Estimated: 53 mL/min — ABNORMAL LOW (ref >60)
Glucose, Bld: 184 mg/dL — ABNORMAL HIGH (ref 70–99)
Potassium: 4.6 mmol/L (ref 3.5–5.1)
Sodium: 136 mmol/L (ref 135–145)
Total Bilirubin: 0.7 mg/dL (ref 0.0–1.2)
Total Protein: 7.1 g/dL (ref 6.5–8.1)

## 2023-10-20 LAB — URINALYSIS, MICROSCOPIC (REFLEX)

## 2023-10-20 LAB — LIPASE, BLOOD: Lipase: 24 U/L (ref 11–51)

## 2023-10-20 MED ORDER — ONDANSETRON HCL 4 MG/2ML IJ SOLN
4.0000 mg | Freq: Once | INTRAMUSCULAR | Status: AC
  Administered 2023-10-20: 4 mg via INTRAVENOUS
  Filled 2023-10-20: qty 2

## 2023-10-20 MED ORDER — FENTANYL CITRATE PF 50 MCG/ML IJ SOSY
50.0000 ug | PREFILLED_SYRINGE | Freq: Once | INTRAMUSCULAR | Status: AC
  Administered 2023-10-20: 50 ug via INTRAVENOUS
  Filled 2023-10-20: qty 1

## 2023-10-20 MED ORDER — IOHEXOL 350 MG/ML SOLN
65.0000 mL | Freq: Once | INTRAVENOUS | Status: AC | PRN
  Administered 2023-10-20: 65 mL via INTRAVENOUS

## 2023-10-20 MED ORDER — KETOROLAC TROMETHAMINE 15 MG/ML IJ SOLN
15.0000 mg | Freq: Once | INTRAMUSCULAR | Status: AC
Start: 2023-10-20 — End: 2023-10-20
  Administered 2023-10-20: 15 mg via INTRAVENOUS

## 2023-10-20 MED ORDER — CEPHALEXIN 250 MG PO CAPS
500.0000 mg | ORAL_CAPSULE | Freq: Once | ORAL | Status: AC
  Administered 2023-10-20: 500 mg via ORAL
  Filled 2023-10-20: qty 2

## 2023-10-20 MED ORDER — CEPHALEXIN 500 MG PO CAPS: 500.0000 mg | ORAL_CAPSULE | Freq: Three times a day (TID) | ORAL | 0 refills | Status: AC

## 2023-10-20 MED ORDER — ONDANSETRON HCL 4 MG PO TABS: 4.0000 mg | ORAL_TABLET | Freq: Four times a day (QID) | ORAL | 0 refills | Status: AC
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
## 2023-10-20 NOTE — ED Provider Notes (Addendum)
  Physical Exam  BP (!) 173/65 (BP Location: Right Arm)   Pulse 98   Temp 98.4 F (36.9 C)   Resp 18   SpO2 94%   Physical Exam  Procedures  Procedures  ED Course / MDM    Medical Decision Making Amount and/or Complexity of Data Reviewed Labs: ordered. Radiology: ordered.  Risk Prescription drug management.   Assuming care of patient from Dr. Jacqulyn Bath   Patient in the ED for flank pain. Hx of DM. Workup thus far shows reassuring labs, including cbc and cmp.  Concerning findings are as following UA with some markers for infection. Important pending results are CT abd.  According to Dr. Jacqulyn Bath, plan is to d/c the patient for uti if CT neg fro stones. He doesn't think clinically she has pyelo unless CT concerning for it.    Patient had no complains, no concerns from the nursing side. Will continue to monitor.    Derwood Kaplan, MD 10/20/23 1557   6:10 PM CT scan results are reassuring.  I discussed this results with the patient.  She is tearful, complaining of pain.  Patient drove to the emergency room.  I discussed with her that the primary team had assessed her, CT scan is the best next step, which is normal and therefore reassuring.  We will give her some Toradol right now.  I also discussed with her the positive leukocytes in her urine and that we are sending her home with some Keflex.  We advised PCP follow-up.   Derwood Kaplan, MD 10/20/23 [phone]

## 2023-10-20 NOTE — ED Triage Notes (Signed)
 Patient with hx of gastroparesis here for eval of three days of R flank. Endorses nausea without vomiting. Taking Tylenol and Advil without relief. Taking 50mg  Tramadol for neck pain but it is not helping the abdominal pain either.

## 2023-10-20 NOTE — Discharge Instructions (Signed)
 The CT scan and blood work in the emergency room is reassuring. There is a small possibility that you could have a bladder infection, therefore we are sending you home with antibiotics that can help treated.  Please return to the ER if your symptoms worsen; you have increased pain, fevers, chills, inability to keep any medications down, confusion. Otherwise see the outpatient doctor as requested.

## 2023-10-22 LAB — URINE CULTURE: Culture: <10000 — AB

## 2023-11-01 DIAGNOSIS — E1069 Type 1 diabetes mellitus with other specified complication: Secondary | ICD-10-CM | POA: Diagnosis not present

## 2023-11-08 DIAGNOSIS — M810 Age-related osteoporosis without current pathological fracture: Secondary | ICD-10-CM | POA: Diagnosis not present

## 2023-11-08 DIAGNOSIS — E559 Vitamin D deficiency, unspecified: Secondary | ICD-10-CM | POA: Diagnosis not present

## 2023-11-08 DIAGNOSIS — E1140 Type 2 diabetes mellitus with diabetic neuropathy, unspecified: Secondary | ICD-10-CM | POA: Diagnosis not present

## 2023-11-08 DIAGNOSIS — N390 Urinary tract infection, site not specified: Secondary | ICD-10-CM | POA: Diagnosis not present

## 2023-11-08 DIAGNOSIS — E109 Type 1 diabetes mellitus without complications: Secondary | ICD-10-CM | POA: Diagnosis not present

## 2023-11-08 DIAGNOSIS — E1069 Type 1 diabetes mellitus with other specified complication: Secondary | ICD-10-CM | POA: Diagnosis not present

## 2023-11-08 DIAGNOSIS — I10 Essential (primary) hypertension: Secondary | ICD-10-CM | POA: Diagnosis not present

## 2023-11-08 DIAGNOSIS — E7800 Pure hypercholesterolemia, unspecified: Secondary | ICD-10-CM | POA: Diagnosis not present

## 2023-11-08 DIAGNOSIS — Z794 Long term (current) use of insulin: Secondary | ICD-10-CM | POA: Diagnosis not present

## 2023-11-08 DIAGNOSIS — E039 Hypothyroidism, unspecified: Secondary | ICD-10-CM | POA: Diagnosis not present

## 2023-11-08 DIAGNOSIS — Z9641 Presence of insulin pump (external) (internal): Secondary | ICD-10-CM | POA: Diagnosis not present

## 2023-11-08 DIAGNOSIS — K3184 Gastroparesis: Secondary | ICD-10-CM | POA: Diagnosis not present

## 2023-11-09 DIAGNOSIS — M810 Age-related osteoporosis without current pathological fracture: Secondary | ICD-10-CM | POA: Diagnosis not present

## 2023-11-09 DIAGNOSIS — E039 Hypothyroidism, unspecified: Secondary | ICD-10-CM | POA: Diagnosis not present

## 2023-11-09 DIAGNOSIS — R109 Unspecified abdominal pain: Secondary | ICD-10-CM | POA: Diagnosis not present

## 2023-11-09 DIAGNOSIS — N182 Chronic kidney disease, stage 2 (mild): Secondary | ICD-10-CM | POA: Diagnosis not present

## 2023-11-09 DIAGNOSIS — J410 Simple chronic bronchitis: Secondary | ICD-10-CM | POA: Diagnosis not present

## 2023-11-09 DIAGNOSIS — E7800 Pure hypercholesterolemia, unspecified: Secondary | ICD-10-CM | POA: Diagnosis not present

## 2023-11-09 DIAGNOSIS — Z794 Long term (current) use of insulin: Secondary | ICD-10-CM | POA: Diagnosis not present

## 2023-11-09 DIAGNOSIS — I10 Essential (primary) hypertension: Secondary | ICD-10-CM | POA: Diagnosis not present

## 2023-11-09 DIAGNOSIS — Z9641 Presence of insulin pump (external) (internal): Secondary | ICD-10-CM | POA: Diagnosis not present

## 2023-11-09 DIAGNOSIS — E1069 Type 1 diabetes mellitus with other specified complication: Secondary | ICD-10-CM | POA: Diagnosis not present

## 2023-11-10 DIAGNOSIS — M4802 Spinal stenosis, cervical region: Secondary | ICD-10-CM | POA: Diagnosis not present

## 2023-11-17 DIAGNOSIS — E119 Type 2 diabetes mellitus without complications: Secondary | ICD-10-CM | POA: Diagnosis not present

## 2023-11-17 DIAGNOSIS — B351 Tinea unguium: Secondary | ICD-10-CM | POA: Diagnosis not present

## 2023-11-17 DIAGNOSIS — L84 Corns and callosities: Secondary | ICD-10-CM | POA: Diagnosis not present

## 2023-11-17 DIAGNOSIS — R2689 Other abnormalities of gait and mobility: Secondary | ICD-10-CM | POA: Diagnosis not present

## 2023-11-17 DIAGNOSIS — M79675 Pain in left toe(s): Secondary | ICD-10-CM | POA: Diagnosis not present

## 2023-11-23 ENCOUNTER — Ambulatory Visit
Admission: RE | Admit: 2023-11-23 | Discharge: 2023-11-23 | Disposition: A | Discharge status: Home / Self Care | Source: Ambulatory Visit | Attending: Internal Medicine | Admitting: Internal Medicine

## 2023-11-23 DIAGNOSIS — N63 Unspecified lump in unspecified breast: Secondary | ICD-10-CM

## 2023-11-23 DIAGNOSIS — N6310 Unspecified lump in the right breast, unspecified quadrant: Secondary | ICD-10-CM | POA: Diagnosis not present

## 2023-11-23 DIAGNOSIS — N6320 Unspecified lump in the left breast, unspecified quadrant: Secondary | ICD-10-CM | POA: Diagnosis not present

## 2023-11-23 DIAGNOSIS — N6311 Unspecified lump in the right breast, upper outer quadrant: Secondary | ICD-10-CM | POA: Diagnosis not present

## 2023-11-23 DIAGNOSIS — N6323 Unspecified lump in the left breast, lower outer quadrant: Secondary | ICD-10-CM | POA: Diagnosis not present

## 2023-11-28 DIAGNOSIS — Z6829 Body mass index (BMI) 29.0-29.9, adult: Secondary | ICD-10-CM | POA: Diagnosis not present

## 2023-11-28 DIAGNOSIS — M4322 Fusion of spine, cervical region: Secondary | ICD-10-CM | POA: Diagnosis not present

## 2024-01-23 DIAGNOSIS — B351 Tinea unguium: Secondary | ICD-10-CM | POA: Diagnosis not present

## 2024-01-23 DIAGNOSIS — L84 Corns and callosities: Secondary | ICD-10-CM | POA: Diagnosis not present

## 2024-01-23 DIAGNOSIS — M79675 Pain in left toe(s): Secondary | ICD-10-CM | POA: Diagnosis not present

## 2024-01-23 DIAGNOSIS — E119 Type 2 diabetes mellitus without complications: Secondary | ICD-10-CM | POA: Diagnosis not present

## 2024-01-23 DIAGNOSIS — M2011 Hallux valgus (acquired), right foot: Secondary | ICD-10-CM | POA: Diagnosis not present

## 2024-01-31 DIAGNOSIS — E039 Hypothyroidism, unspecified: Secondary | ICD-10-CM | POA: Diagnosis not present

## 2024-02-07 DIAGNOSIS — E1140 Type 2 diabetes mellitus with diabetic neuropathy, unspecified: Secondary | ICD-10-CM | POA: Diagnosis not present

## 2024-02-07 DIAGNOSIS — Z9641 Presence of insulin pump (external) (internal): Secondary | ICD-10-CM | POA: Diagnosis not present

## 2024-02-07 DIAGNOSIS — E109 Type 1 diabetes mellitus without complications: Secondary | ICD-10-CM | POA: Diagnosis not present

## 2024-02-07 DIAGNOSIS — E756 Lipid storage disorder, unspecified: Secondary | ICD-10-CM | POA: Diagnosis not present

## 2024-02-07 DIAGNOSIS — E039 Hypothyroidism, unspecified: Secondary | ICD-10-CM | POA: Diagnosis not present

## 2024-02-07 DIAGNOSIS — E559 Vitamin D deficiency, unspecified: Secondary | ICD-10-CM | POA: Diagnosis not present

## 2024-02-07 DIAGNOSIS — M810 Age-related osteoporosis without current pathological fracture: Secondary | ICD-10-CM | POA: Diagnosis not present

## 2024-02-07 DIAGNOSIS — I10 Essential (primary) hypertension: Secondary | ICD-10-CM | POA: Diagnosis not present

## 2024-02-07 DIAGNOSIS — E1069 Type 1 diabetes mellitus with other specified complication: Secondary | ICD-10-CM | POA: Diagnosis not present

## 2024-02-07 DIAGNOSIS — Z794 Long term (current) use of insulin: Secondary | ICD-10-CM | POA: Diagnosis not present

## 2024-02-07 DIAGNOSIS — E7800 Pure hypercholesterolemia, unspecified: Secondary | ICD-10-CM | POA: Diagnosis not present

## 2024-02-22 DIAGNOSIS — Z79899 Other long term (current) drug therapy: Secondary | ICD-10-CM | POA: Diagnosis not present

## 2024-02-22 DIAGNOSIS — M5442 Lumbago with sciatica, left side: Secondary | ICD-10-CM | POA: Diagnosis not present

## 2024-02-22 DIAGNOSIS — F17210 Nicotine dependence, cigarettes, uncomplicated: Secondary | ICD-10-CM | POA: Diagnosis not present

## 2024-02-22 DIAGNOSIS — Z6829 Body mass index (BMI) 29.0-29.9, adult: Secondary | ICD-10-CM | POA: Diagnosis not present

## 2024-02-22 DIAGNOSIS — G8929 Other chronic pain: Secondary | ICD-10-CM | POA: Diagnosis not present

## 2024-02-22 DIAGNOSIS — M5441 Lumbago with sciatica, right side: Secondary | ICD-10-CM | POA: Diagnosis not present

## 2024-02-22 DIAGNOSIS — E1042 Type 1 diabetes mellitus with diabetic polyneuropathy: Secondary | ICD-10-CM | POA: Diagnosis not present

## 2024-02-22 DIAGNOSIS — M810 Age-related osteoporosis without current pathological fracture: Secondary | ICD-10-CM | POA: Diagnosis not present

## 2024-02-24 DIAGNOSIS — Z79899 Other long term (current) drug therapy: Secondary | ICD-10-CM | POA: Diagnosis not present

## 2024-03-01 DIAGNOSIS — M4802 Spinal stenosis, cervical region: Secondary | ICD-10-CM | POA: Diagnosis not present

## 2024-03-12 DIAGNOSIS — M4802 Spinal stenosis, cervical region: Secondary | ICD-10-CM | POA: Diagnosis not present

## 2024-03-14 DIAGNOSIS — M4802 Spinal stenosis, cervical region: Secondary | ICD-10-CM | POA: Diagnosis not present

## 2024-03-20 DIAGNOSIS — M4802 Spinal stenosis, cervical region: Secondary | ICD-10-CM | POA: Diagnosis not present

## 2024-03-26 DIAGNOSIS — M4802 Spinal stenosis, cervical region: Secondary | ICD-10-CM | POA: Diagnosis not present

## 2024-03-28 DIAGNOSIS — M4802 Spinal stenosis, cervical region: Secondary | ICD-10-CM | POA: Diagnosis not present

## 2024-04-02 DIAGNOSIS — M2011 Hallux valgus (acquired), right foot: Secondary | ICD-10-CM | POA: Diagnosis not present

## 2024-04-02 DIAGNOSIS — B351 Tinea unguium: Secondary | ICD-10-CM | POA: Diagnosis not present

## 2024-04-02 DIAGNOSIS — L84 Corns and callosities: Secondary | ICD-10-CM | POA: Diagnosis not present

## 2024-04-02 DIAGNOSIS — E119 Type 2 diabetes mellitus without complications: Secondary | ICD-10-CM | POA: Diagnosis not present

## 2024-04-02 DIAGNOSIS — M4802 Spinal stenosis, cervical region: Secondary | ICD-10-CM | POA: Diagnosis not present

## 2024-04-02 DIAGNOSIS — M79675 Pain in left toe(s): Secondary | ICD-10-CM | POA: Diagnosis not present

## 2024-04-04 DIAGNOSIS — M4802 Spinal stenosis, cervical region: Secondary | ICD-10-CM | POA: Diagnosis not present

## 2024-04-10 DIAGNOSIS — M4802 Spinal stenosis, cervical region: Secondary | ICD-10-CM | POA: Diagnosis not present

## 2024-04-12 DIAGNOSIS — M4805 Spinal stenosis, thoracolumbar region: Secondary | ICD-10-CM | POA: Diagnosis not present

## 2024-04-12 DIAGNOSIS — M4802 Spinal stenosis, cervical region: Secondary | ICD-10-CM | POA: Diagnosis not present

## 2024-04-17 DIAGNOSIS — Z23 Encounter for immunization: Secondary | ICD-10-CM | POA: Diagnosis not present

## 2024-04-17 DIAGNOSIS — M4805 Spinal stenosis, thoracolumbar region: Secondary | ICD-10-CM | POA: Diagnosis not present

## 2024-04-17 DIAGNOSIS — M4802 Spinal stenosis, cervical region: Secondary | ICD-10-CM | POA: Diagnosis not present

## 2024-05-01 DIAGNOSIS — M4802 Spinal stenosis, cervical region: Secondary | ICD-10-CM | POA: Diagnosis not present

## 2024-05-01 DIAGNOSIS — M4805 Spinal stenosis, thoracolumbar region: Secondary | ICD-10-CM | POA: Diagnosis not present

## 2024-05-02 DIAGNOSIS — E039 Hypothyroidism, unspecified: Secondary | ICD-10-CM | POA: Diagnosis not present

## 2024-05-02 DIAGNOSIS — E1069 Type 1 diabetes mellitus with other specified complication: Secondary | ICD-10-CM | POA: Diagnosis not present

## 2024-05-02 DIAGNOSIS — E7800 Pure hypercholesterolemia, unspecified: Secondary | ICD-10-CM | POA: Diagnosis not present

## 2024-05-02 DIAGNOSIS — Z9641 Presence of insulin pump (external) (internal): Secondary | ICD-10-CM | POA: Diagnosis not present

## 2024-05-02 DIAGNOSIS — M810 Age-related osteoporosis without current pathological fracture: Secondary | ICD-10-CM | POA: Diagnosis not present

## 2024-05-02 DIAGNOSIS — N182 Chronic kidney disease, stage 2 (mild): Secondary | ICD-10-CM | POA: Diagnosis not present

## 2024-05-02 DIAGNOSIS — Z794 Long term (current) use of insulin: Secondary | ICD-10-CM | POA: Diagnosis not present

## 2024-05-02 DIAGNOSIS — I10 Essential (primary) hypertension: Secondary | ICD-10-CM | POA: Diagnosis not present

## 2024-05-03 DIAGNOSIS — M4805 Spinal stenosis, thoracolumbar region: Secondary | ICD-10-CM | POA: Diagnosis not present

## 2024-05-03 DIAGNOSIS — M4802 Spinal stenosis, cervical region: Secondary | ICD-10-CM | POA: Diagnosis not present

## 2024-05-08 DIAGNOSIS — M4802 Spinal stenosis, cervical region: Secondary | ICD-10-CM | POA: Diagnosis not present

## 2024-05-08 DIAGNOSIS — M4805 Spinal stenosis, thoracolumbar region: Secondary | ICD-10-CM | POA: Diagnosis not present

## 2024-05-10 DIAGNOSIS — M4802 Spinal stenosis, cervical region: Secondary | ICD-10-CM | POA: Diagnosis not present

## 2024-05-10 DIAGNOSIS — M4805 Spinal stenosis, thoracolumbar region: Secondary | ICD-10-CM | POA: Diagnosis not present

## 2024-05-15 DIAGNOSIS — M4802 Spinal stenosis, cervical region: Secondary | ICD-10-CM | POA: Diagnosis not present

## 2024-05-15 DIAGNOSIS — M4805 Spinal stenosis, thoracolumbar region: Secondary | ICD-10-CM | POA: Diagnosis not present

## 2024-05-16 DIAGNOSIS — Z794 Long term (current) use of insulin: Secondary | ICD-10-CM | POA: Diagnosis not present

## 2024-05-16 DIAGNOSIS — E039 Hypothyroidism, unspecified: Secondary | ICD-10-CM | POA: Diagnosis not present

## 2024-05-16 DIAGNOSIS — F17200 Nicotine dependence, unspecified, uncomplicated: Secondary | ICD-10-CM | POA: Diagnosis not present

## 2024-05-16 DIAGNOSIS — E7800 Pure hypercholesterolemia, unspecified: Secondary | ICD-10-CM | POA: Diagnosis not present

## 2024-05-16 DIAGNOSIS — E1069 Type 1 diabetes mellitus with other specified complication: Secondary | ICD-10-CM | POA: Diagnosis not present

## 2024-05-16 DIAGNOSIS — K047 Periapical abscess without sinus: Secondary | ICD-10-CM | POA: Diagnosis not present

## 2024-05-16 DIAGNOSIS — M810 Age-related osteoporosis without current pathological fracture: Secondary | ICD-10-CM | POA: Diagnosis not present

## 2024-05-16 DIAGNOSIS — J410 Simple chronic bronchitis: Secondary | ICD-10-CM | POA: Diagnosis not present

## 2024-05-16 DIAGNOSIS — I10 Essential (primary) hypertension: Secondary | ICD-10-CM | POA: Diagnosis not present

## 2024-05-16 DIAGNOSIS — N182 Chronic kidney disease, stage 2 (mild): Secondary | ICD-10-CM | POA: Diagnosis not present

## 2024-05-16 DIAGNOSIS — Z9641 Presence of insulin pump (external) (internal): Secondary | ICD-10-CM | POA: Diagnosis not present

## 2024-05-21 DIAGNOSIS — E109 Type 1 diabetes mellitus without complications: Secondary | ICD-10-CM | POA: Diagnosis not present

## 2024-05-21 DIAGNOSIS — E559 Vitamin D deficiency, unspecified: Secondary | ICD-10-CM | POA: Diagnosis not present

## 2024-05-21 DIAGNOSIS — M810 Age-related osteoporosis without current pathological fracture: Secondary | ICD-10-CM | POA: Diagnosis not present

## 2024-05-21 DIAGNOSIS — Z9641 Presence of insulin pump (external) (internal): Secondary | ICD-10-CM | POA: Diagnosis not present

## 2024-05-21 DIAGNOSIS — Z794 Long term (current) use of insulin: Secondary | ICD-10-CM | POA: Diagnosis not present

## 2024-05-21 DIAGNOSIS — I10 Essential (primary) hypertension: Secondary | ICD-10-CM | POA: Diagnosis not present

## 2024-05-21 DIAGNOSIS — E039 Hypothyroidism, unspecified: Secondary | ICD-10-CM | POA: Diagnosis not present

## 2024-05-21 DIAGNOSIS — E1069 Type 1 diabetes mellitus with other specified complication: Secondary | ICD-10-CM | POA: Diagnosis not present

## 2024-05-21 DIAGNOSIS — E1140 Type 2 diabetes mellitus with diabetic neuropathy, unspecified: Secondary | ICD-10-CM | POA: Diagnosis not present

## 2024-05-22 DIAGNOSIS — M4802 Spinal stenosis, cervical region: Secondary | ICD-10-CM | POA: Diagnosis not present

## 2024-05-22 DIAGNOSIS — M4805 Spinal stenosis, thoracolumbar region: Secondary | ICD-10-CM | POA: Diagnosis not present

## 2024-05-24 DIAGNOSIS — K3184 Gastroparesis: Secondary | ICD-10-CM | POA: Diagnosis not present

## 2024-05-24 DIAGNOSIS — M4802 Spinal stenosis, cervical region: Secondary | ICD-10-CM | POA: Diagnosis not present

## 2024-05-24 DIAGNOSIS — Z83719 Family history of colon polyps, unspecified: Secondary | ICD-10-CM | POA: Diagnosis not present

## 2024-05-24 DIAGNOSIS — Z1211 Encounter for screening for malignant neoplasm of colon: Secondary | ICD-10-CM | POA: Diagnosis not present

## 2024-05-24 DIAGNOSIS — R194 Change in bowel habit: Secondary | ICD-10-CM | POA: Diagnosis not present

## 2024-05-24 DIAGNOSIS — M4805 Spinal stenosis, thoracolumbar region: Secondary | ICD-10-CM | POA: Diagnosis not present

## 2024-05-31 DIAGNOSIS — M4802 Spinal stenosis, cervical region: Secondary | ICD-10-CM | POA: Diagnosis not present

## 2024-05-31 DIAGNOSIS — M4805 Spinal stenosis, thoracolumbar region: Secondary | ICD-10-CM | POA: Diagnosis not present

## 2024-06-05 DIAGNOSIS — M4802 Spinal stenosis, cervical region: Secondary | ICD-10-CM | POA: Diagnosis not present

## 2024-06-05 DIAGNOSIS — M4805 Spinal stenosis, thoracolumbar region: Secondary | ICD-10-CM | POA: Diagnosis not present

## 2024-06-06 DIAGNOSIS — M4802 Spinal stenosis, cervical region: Secondary | ICD-10-CM | POA: Diagnosis not present

## 2024-06-06 DIAGNOSIS — M4805 Spinal stenosis, thoracolumbar region: Secondary | ICD-10-CM | POA: Diagnosis not present

## 2024-06-11 DIAGNOSIS — M4805 Spinal stenosis, thoracolumbar region: Secondary | ICD-10-CM | POA: Diagnosis not present

## 2024-06-11 DIAGNOSIS — M4802 Spinal stenosis, cervical region: Secondary | ICD-10-CM | POA: Diagnosis not present

## 2024-06-13 DIAGNOSIS — M4805 Spinal stenosis, thoracolumbar region: Secondary | ICD-10-CM | POA: Diagnosis not present

## 2024-06-13 DIAGNOSIS — M4802 Spinal stenosis, cervical region: Secondary | ICD-10-CM | POA: Diagnosis not present

## 2024-06-18 DIAGNOSIS — M4805 Spinal stenosis, thoracolumbar region: Secondary | ICD-10-CM | POA: Diagnosis not present

## 2024-06-18 DIAGNOSIS — M4802 Spinal stenosis, cervical region: Secondary | ICD-10-CM | POA: Diagnosis not present

## 2024-06-20 DIAGNOSIS — M4802 Spinal stenosis, cervical region: Secondary | ICD-10-CM | POA: Diagnosis not present

## 2024-06-20 DIAGNOSIS — M4805 Spinal stenosis, thoracolumbar region: Secondary | ICD-10-CM | POA: Diagnosis not present

## 2024-06-27 DIAGNOSIS — E119 Type 2 diabetes mellitus without complications: Secondary | ICD-10-CM | POA: Diagnosis not present

## 2024-06-27 DIAGNOSIS — K635 Polyp of colon: Secondary | ICD-10-CM | POA: Diagnosis not present

## 2024-06-27 DIAGNOSIS — Z1211 Encounter for screening for malignant neoplasm of colon: Secondary | ICD-10-CM | POA: Diagnosis not present

## 2024-07-02 DIAGNOSIS — E039 Hypothyroidism, unspecified: Secondary | ICD-10-CM | POA: Diagnosis not present

## 2024-07-02 DIAGNOSIS — E1069 Type 1 diabetes mellitus with other specified complication: Secondary | ICD-10-CM | POA: Diagnosis not present
